# Patient Record
Sex: Male | Born: 1980
Health system: Southern US, Community
[De-identification: ages and names within clinical notes are randomized; demographics above are authoritative.]

## PROBLEM LIST (undated history)

## (undated) DIAGNOSIS — K219 Gastro-esophageal reflux disease without esophagitis: Secondary | ICD-10-CM

## (undated) HISTORY — DX: Gastro-esophageal reflux disease without esophagitis: K21.9

## (undated) HISTORY — PX: SHOULDER SURGERY: SHX246

---

## 2016-01-21 ENCOUNTER — Ambulatory Visit (INDEPENDENT_AMBULATORY_CARE_PROVIDER_SITE_OTHER): Payer: Managed Care, Other (non HMO) | Admitting: Internal Medicine

## 2016-01-21 ENCOUNTER — Other Ambulatory Visit (INDEPENDENT_AMBULATORY_CARE_PROVIDER_SITE_OTHER): Payer: Managed Care, Other (non HMO)

## 2016-01-21 ENCOUNTER — Encounter: Payer: Self-pay | Admitting: Internal Medicine

## 2016-01-21 VITALS — BP 110/78 | HR 73 | Temp 98.0°F | Resp 16 | Ht 60.0 in | Wt 222.0 lb

## 2016-01-21 DIAGNOSIS — G8929 Other chronic pain: Secondary | ICD-10-CM | POA: Insufficient documentation

## 2016-01-21 DIAGNOSIS — R51 Headache: Secondary | ICD-10-CM

## 2016-01-21 DIAGNOSIS — M25511 Pain in right shoulder: Secondary | ICD-10-CM

## 2016-01-21 DIAGNOSIS — R519 Headache, unspecified: Secondary | ICD-10-CM | POA: Insufficient documentation

## 2016-01-21 DIAGNOSIS — K219 Gastro-esophageal reflux disease without esophagitis: Secondary | ICD-10-CM | POA: Diagnosis not present

## 2016-01-21 DIAGNOSIS — Z114 Encounter for screening for human immunodeficiency virus [HIV]: Secondary | ICD-10-CM

## 2016-01-21 DIAGNOSIS — G44009 Cluster headache syndrome, unspecified, not intractable: Secondary | ICD-10-CM | POA: Diagnosis not present

## 2016-01-21 DIAGNOSIS — R7989 Other specified abnormal findings of blood chemistry: Secondary | ICD-10-CM | POA: Diagnosis not present

## 2016-01-21 DIAGNOSIS — Z23 Encounter for immunization: Secondary | ICD-10-CM | POA: Diagnosis not present

## 2016-01-21 DIAGNOSIS — Z0000 Encounter for general adult medical examination without abnormal findings: Secondary | ICD-10-CM

## 2016-01-21 LAB — COMPREHENSIVE METABOLIC PANEL WITH GFR
ALT: 42 U/L (ref 0–53)
AST: 21 U/L (ref 0–37)
Albumin: 4.7 g/dL (ref 3.5–5.2)
Alkaline Phosphatase: 44 U/L (ref 39–117)
BUN: 12 mg/dL (ref 6–23)
CO2: 27 meq/L (ref 19–32)
Calcium: 9.7 mg/dL (ref 8.4–10.5)
Chloride: 102 meq/L (ref 96–112)
Creatinine, Ser: 1.08 mg/dL (ref 0.40–1.50)
GFR: 83.41 mL/min
Glucose, Bld: 82 mg/dL (ref 70–99)
Potassium: 3.9 meq/L (ref 3.5–5.1)
Sodium: 138 meq/L (ref 135–145)
Total Bilirubin: 0.9 mg/dL (ref 0.2–1.2)
Total Protein: 7.3 g/dL (ref 6.0–8.3)

## 2016-01-21 LAB — CBC WITH DIFFERENTIAL/PLATELET
BASOS PCT: 0.5 % (ref 0.0–3.0)
Basophils Absolute: 0 10*3/uL (ref 0.0–0.1)
EOS ABS: 0.1 10*3/uL (ref 0.0–0.7)
EOS PCT: 1.3 % (ref 0.0–5.0)
HEMATOCRIT: 43 % (ref 39.0–52.0)
HEMOGLOBIN: 15.2 g/dL (ref 13.0–17.0)
LYMPHS PCT: 33.3 % (ref 12.0–46.0)
Lymphs Abs: 2.5 10*3/uL (ref 0.7–4.0)
MCHC: 35.3 g/dL (ref 30.0–36.0)
MCV: 86.5 fl (ref 78.0–100.0)
MONO ABS: 0.5 10*3/uL (ref 0.1–1.0)
Monocytes Relative: 6.8 % (ref 3.0–12.0)
Neutro Abs: 4.3 10*3/uL (ref 1.4–7.7)
Neutrophils Relative %: 58.1 % (ref 43.0–77.0)
Platelets: 275 10*3/uL (ref 150.0–400.0)
RBC: 4.97 Mil/uL (ref 4.22–5.81)
RDW: 12.8 % (ref 11.5–15.5)
WBC: 7.5 10*3/uL (ref 4.0–10.5)

## 2016-01-21 LAB — TSH: TSH: 1.28 u[IU]/mL (ref 0.35–4.50)

## 2016-01-21 LAB — LIPID PANEL
CHOLESTEROL: 268 mg/dL — AB (ref 0–200)
HDL: 35.4 mg/dL — AB (ref >39.00)
NonHDL: 232.65
Total CHOL/HDL Ratio: 8
Triglycerides: 328 mg/dL — ABNORMAL HIGH (ref 0.0–149.0)
VLDL: 65.6 mg/dL — AB (ref 0.0–40.0)

## 2016-01-21 LAB — LDL CHOLESTEROL, DIRECT: Direct LDL: 150 mg/dL

## 2016-01-21 MED ORDER — LANSOPRAZOLE 30 MG PO TBDP: 30.0000 mg | ORAL_TABLET | Freq: Every day | ORAL | 1 refills | Status: DC

## 2016-01-21 MED ORDER — HYDROCODONE-ACETAMINOPHEN 5-325 MG PO TABS: 1.0000 | ORAL_TABLET | Freq: Every day | ORAL | 0 refills | Status: DC | PRN

## 2016-01-21 NOTE — Progress Notes (Signed)
Subjective:    Patient ID: Jamie Powell, male    DOB: 1980-08-29, 35 y.o.   MRN: [id number]  HPI He is here to establish with a new pcp.     His shoulder aches in the winter.  He has seen orthopedics in the past.  He has had surgery x 3 - laparoscopic surgeries.    He was taking hydrocodone once daily, but not every day in the winter only.  It does not affect him negatively -  There are no side effects.  The pain is significant in the winter and he feels he needs something to help him get through the day. 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   GERD:  He has a long history of GERD.  He has tried several medications in the past, including nexium, prilosec, prevacid and protonix.  Only prevacid works.  It is expensive and he wonders what he can take - other otc medications do not work.  He does drink coffee in the morning, drinks some red bull and beer at night.  He does chew tobacco and knows he should stop.  His diet could be improved.     Medications and allergies reviewed with patient and updated if appropriate.  Patient Active Problem List   Diagnosis Date Noted  . Chronic right shoulder pain 01/21/2016  . GERD (gastroesophageal reflux disease) 01/21/2016  . Headache 01/21/2016    No current outpatient prescriptions on file prior to visit.   No current facility-administered medications on file prior to visit.     Past Medical History:  Diagnosis Date  . GERD (gastroesophageal reflux disease)     Past Surgical History:  Procedure Laterality Date  . SHOULDER SURGERY      Social History   Social History  . Marital status: Married    Spouse name: N/A  . Number of children: N/A  . Years of education: N/A   Social History Main Topics  . Smoking status: Former Games developermoker  . Smokeless tobacco: Current User  . Alcohol use Yes  . Drug use: No  . Sexual activity: Not Asked   Other Topics Concern  . None   Social History Financial traderarrative   mechanic    Family  History  Problem Relation Age of Onset  . Hypertension Maternal Grandfather   . Heart disease Maternal Grandfather   . Cancer Paternal Grandmother   . Stroke Paternal Grandfather   . Diverticulitis Father     Review of Systems  Constitutional: Negative for chills and fever.  HENT: Negative for hearing loss.   Eyes: Negative for visual disturbance.  Respiratory: Negative for cough, shortness of breath and wheezing.   Cardiovascular: Negative for chest pain, palpitations and leg swelling.  Gastrointestinal: Negative for abdominal pain, blood in stool, constipation and diarrhea.       Daily gerd  Genitourinary: Negative for dysuria and hematuria.  Musculoskeletal: Positive for arthralgias (right shoulder, right knee) and myalgias (cramping in ribs).  Neurological: Positive for headaches (cluster). Negative for dizziness and light-headedness.  Psychiatric/Behavioral: Negative for dysphoric mood. The patient is not nervous/anxious (inc stress).        Objective:   Vitals:   01/21/16 1453  BP: 110/78  Pulse: 73  Resp: 16  Temp: 98 F (36.7 C)   Filed Weights   01/21/16 1453  Weight: 222 lb (100.7 kg)   Body mass index is 43.36 kg/m.   Physical Exam Constitutional: He appears well-developed and well-nourished. No  distress.  HENT:  Head: Normocephalic and atraumatic.  Right Ear: External ear normal.  Left Ear: External ear normal.  Mouth/Throat: Oropharynx is clear and moist.  Normal ear canals and TM b/l  Eyes: Conjunctivae and EOM are normal.  Neck: Neck supple. No tracheal deviation present. No thyromegaly present.  No carotid bruit  Cardiovascular: Normal rate, regular rhythm, normal heart sounds and intact distal pulses.   No murmur heard.  No edema Pulmonary/Chest: Effort normal and breath sounds normal. No respiratory distress. He has no wheezes. He has no rales.  Abdominal: Soft. Bowel sounds are normal. He exhibits no distension. There is no tenderness.    Musculoskeletal: right shoulder with scars from prior surgery  Lymphadenopathy:    He has no cervical adenopathy.  Skin: Skin is warm and dry. He is not diaphoretic.  Psychiatric: He has a normal mood and affect. His behavior is normal.         Assessment & Plan:   Physical exam: Screening blood work ordered Immunizations - tetanus and flu vaccines today Exercise - very active at work and does yard work [employer] - elevated BMI, but appears normal for body build Substance abuse - stressed nicotine cessation  See Problem List for Assessment and Plan of chronic medical problems.  F/u annually, sooner if needed

## 2016-01-21 NOTE — Assessment & Plan Note (Addendum)
Stressed nicotine cessation, decreasing caffeine and alcohol Having GERD daily Start prevacid 30 mg daily Stressed importance of getting GERD controlled - risk of esoph. CA Return if not controlled - may need GI eval

## 2016-01-21 NOTE — Patient Instructions (Addendum)
Test(s) ordered today. Your results will be released to MyChart (or called to you) after review, usually within 72hours after test completion. If any changes need to be made, you will be notified at that same time.  All other Health Maintenance issues reviewed.   All recommended immunizations and age-appropriate screenings are up-to-date or discussed.  Flu and tetanus vaccines immunization administered today.   Medications reviewed and updated.  Changes include starting hydrocodone daily for your shoulder pain and prevacid for your stomach.   Your prescription(s) have been submitted to your pharmacy. Please take as directed and contact our office if you believe you are having problem(s) with the medication(s).   Please followup in one year    Heartburn Heartburn is a type of pain or discomfort that can happen in the throat or chest. It is often described as a burning pain. It may also cause a bad taste in the mouth. Heartburn may feel worse when you lie down or bend over, and it is often worse at night. Heartburn may be caused by stomach contents that move back up into the esophagus (reflux). Follow these instructions at home: Take these actions to decrease your discomfort and to help avoid complications. Diet  Follow a diet as recommended by your health care provider. This may involve avoiding foods and drinks such as:  Coffee and tea (with or without caffeine).  Drinks that contain alcohol.  Energy drinks and sports drinks.  Carbonated drinks or sodas.  Chocolate and cocoa.  Peppermint and mint flavorings.  Garlic and onions.  Horseradish.  Spicy and acidic foods, including peppers, chili powder, curry powder, vinegar, hot sauces, and barbecue sauce.  Citrus fruit juices and citrus fruits, such as oranges, lemons, and limes.  Tomato-based foods, such as red sauce, chili, salsa, and pizza with red sauce.  Fried and fatty foods, such as donuts, french fries, potato  chips, and high-fat dressings.  High-fat meats, such as hot dogs and fatty cuts of red and white meats, such as rib eye steak, sausage, ham, and bacon.  High-fat dairy items, such as whole milk, butter, and cream cheese.  Eat small, frequent meals instead of large meals.  Avoid drinking large amounts of liquid with your meals.  Avoid eating meals during the 2-3 hours before bedtime.  Avoid lying down right after you eat.  Do not exercise right after you eat. General instructions  Pay attention to any changes in your symptoms.  Take over-the-counter and prescription medicines only as told by your health care provider. Do not take aspirin, ibuprofen, or other NSAIDs unless your health care provider told you to do so.  Do not use any tobacco products, including cigarettes, chewing tobacco, and e-cigarettes. If you need help quitting, ask your health care provider.  Wear loose-fitting clothing. Do not wear anything tight around your waist that causes pressure on your abdomen.  Raise (elevate) the head of your bed about 6 inches (15 cm).  Try to reduce your stress, such as with yoga or meditation. If you need help reducing stress, ask your health care provider.  If you are overweight, reduce your weight to an amount that is healthy for you. Ask your health care provider for guidance about a safe weight loss goal.  Keep all follow-up visits as told by your health care provider. This is important. Contact a health care provider if:  You have new symptoms.  You have unexplained weight loss.  You have difficulty swallowing, or it hurts to swallow.  You have wheezing or a persistent cough.  Your symptoms do not improve with treatment.  You have frequent heartburn for more than two weeks. Get help right away if:  You have pain in your arms, neck, jaw, teeth, or back.  You feel sweaty, dizzy, or light-headed.  You have chest pain or shortness of breath.  You vomit and your  vomit looks like blood or coffee grounds.  Your stool is bloody or black. This information is not intended to replace advice given to you by your health care provider. Make sure you discuss any questions you have with your health care provider. Document Released: 06/27/2008 Document Revised: 07/17/2015 Document Reviewed: 06/05/2014 Elsevier Interactive Patient Education  2017 Elsevier Inc[employer]   Health Maintenance, Male A healthy lifestyle and preventative care can promote health and wellness.  Maintain regular health, dental, and eye exams.  Eat a healthy diet. Foods like vegetables, fruits, whole grains, low-fat dairy products, and lean protein foods contain the nutrients you need and are low in calories. Decrease your intake of foods high in solid fats, added sugars, and salt. Get information about a proper diet from your health care provider, if necessary.  Regular physical exercise is one of the most important things you can do for your health. Most adults should get at least 150 minutes of moderate-intensity exercise (any activity that increases your heart rate and causes you to sweat) each week. In addition, most adults need muscle-strengthening exercises on 2 or more days a week.   Maintain a healthy weight. The body mass index (BMI) is a screening tool to identify possible weight problems. It provides an estimate of body fat based on height and weight. Your health care provider can find your BMI and can help you achieve or maintain a healthy weight. For males 20 years and older:  A BMI below 18.5 is considered underweight.  A BMI of 18.5 to 24.9 is normal.  A BMI of 25 to 29.9 is considered overweight.  A BMI of 30 and above is considered obese.  Maintain normal blood lipids and cholesterol by exercising and minimizing your intake of saturated fat. Eat a balanced diet with plenty of fruits and vegetables. Blood tests for lipids and cholesterol should begin at age 33 and be repeated  every 5 years. If your lipid or cholesterol levels are high, you are over age 33, or you are at high risk for heart disease, you may need your cholesterol levels checked more frequently.Ongoing high lipid and cholesterol levels should be treated with medicines if diet and exercise are not working.  If you smoke, find out from your health care provider how to quit. If you do not use tobacco, do not start.  Lung cancer screening is recommended for adults aged 55-80 years who are at high risk for developing lung cancer because of a history of smoking. A yearly low-dose CT scan of the lungs is recommended for people who have at least a 30-pack-year history of smoking and are current smokers or have quit within the past 15 years. A pack year of smoking is smoking an average of 1 pack of cigarettes a day for 1 year (for example, a 30-pack-year history of smoking could mean smoking 1 pack a day for 30 years or 2 packs a day for 15 years). Yearly screening should continue until the smoker has stopped smoking for at least 15 years. Yearly screening should be stopped for people who develop a health problem that would prevent them  from having lung cancer treatment.  If you choose to drink alcohol, do not have more than 2 drinks per day. One drink is considered to be 12 oz (360 mL) of beer, 5 oz (150 mL) of wine, or 1.5 oz (45 mL) of liquor.  Avoid the use of street drugs. Do not share needles with anyone. Ask for help if you need support or instructions about stopping the use of drugs.  High blood pressure causes heart disease and increases the risk of stroke. High blood pressure is more likely to develop in:  People who have blood pressure in the end of the normal range (100-139/85-89 mm Hg).  People who are overweight or obese.  People who are African American.  If you are 26-50 years of age, have your blood pressure checked every 3-5 years. If you are 26 years of age or older, have your blood pressure  checked every year. You should have your blood pressure measured twice-once when you are at a hospital or clinic, and once when you are not at a hospital or clinic. Record the average of the two measurements. To check your blood pressure when you are not at a hospital or clinic, you can use:  An automated blood pressure machine at a pharmacy.  A home blood pressure monitor.  If you are 30-86 years old, ask your health care provider if you should take aspirin to prevent heart disease.  Diabetes screening involves taking a blood sample to check your fasting blood sugar level. This should be done once every 3 years after age 68 if you are at a normal weight and without risk factors for diabetes. Testing should be considered at a younger age or be carried out more frequently if you are overweight and have at least 1 risk factor for diabetes.  Colorectal cancer can be detected and often prevented. Most routine colorectal cancer screening begins at the age of 4 and continues through age 61. However, your health care provider may recommend screening at an earlier age if you have risk factors for colon cancer. On a yearly basis, your health care provider may provide home test kits to check for hidden blood in the stool. A small camera at the end of a tube may be used to directly examine the colon (sigmoidoscopy or colonoscopy) to detect the earliest forms of colorectal cancer. Talk to your health care provider about this at age 53 when routine screening begins. A direct exam of the colon should be repeated every 5-10 years through age 74, unless early forms of precancerous polyps or small growths are found.  People who are at an increased risk for hepatitis B should be screened for this virus. You are considered at high risk for hepatitis B if:  You were born in a country where hepatitis B occurs often. Talk with your health care provider about which countries are considered high risk.  Your parents were  born in a high-risk country and you have not received a shot to protect against hepatitis B (hepatitis B vaccine).  You have HIV or AIDS.  You use needles to inject street drugs.  You live with, or have sex with, someone who has hepatitis B.  You are a man who has sex with other men (MSM).  You get hemodialysis treatment.  You take certain medicines for conditions like cancer, organ transplantation, and autoimmune conditions.  Hepatitis C blood testing is recommended for all people born from [birth year] through 1963 and any individual with  known risk factors for hepatitis C.  Healthy men should no longer receive prostate-specific antigen (PSA) blood tests as part of routine cancer screening. Talk to your health care provider about prostate cancer screening.  Testicular cancer screening is not recommended for adolescents or adult males who have no symptoms. Screening includes self-exam, a health care provider exam, and other screening tests. Consult with your health care provider about any symptoms you have or any concerns you have about testicular cancer.  Practice safe sex. Use condoms and avoid high-risk sexual practices to reduce the spread of sexually transmitted infections (STIs).  You should be screened for STIs, including gonorrhea and chlamydia if:  You are sexually active and are younger than 24 years.  You are older than 24 years, and your health care provider tells you that you are at risk for this type of infection.  Your sexual activity has changed since you were last screened, and you are at an increased risk for chlamydia or gonorrhea. Ask your health care provider if you are at risk.  If you are at risk of being infected with HIV, it is recommended that you take a prescription medicine daily to prevent HIV infection. This is called pre-exposure prophylaxis (PrEP). You are considered at risk if:  You are a man who has sex with other men (MSM).  You are a heterosexual man who  is sexually active with multiple partners.  You take drugs by injection.  You are sexually active with a partner who has HIV.  Talk with your health care provider about whether you are at high risk of being infected with HIV. If you choose to begin PrEP, you should first be tested for HIV. You should then be tested every 3 months for as long as you are taking PrEP.  Use sunscreen. Apply sunscreen liberally and repeatedly throughout the day. You should seek shade when your shadow is shorter than you. Protect yourself by wearing long sleeves, pants, a wide-brimmed hat, and sunglasses year round whenever you are outdoors.  Tell your health care provider of new moles or changes in moles, especially if there is a change in shape or color. Also, tell your health care provider if a mole is larger than the size of a pencil eraser.  A one-time screening for abdominal aortic aneurysm (AAA) and surgical repair of large AAAs by ultrasound is recommended for men aged 65-75 years who are current or former smokers.  Stay current with your vaccines (immunizations). This information is not intended to replace advice given to you by your health care provider. Make sure you discuss any questions you have with your health care provider. Document Released: 08/07/2007 Document Revised: 03/01/2014 Document Reviewed: 11/12/2014 Elsevier Interactive Patient Education  2017 [employer].

## 2016-01-21 NOTE — Assessment & Plan Note (Addendum)
Saw a neurologist in past - cluster headaches Improving with chiropractor adjustements

## 2016-01-21 NOTE — Progress Notes (Signed)
Pre visit review using our clinic review tool, if applicable. No additional management support is needed unless otherwise documented below in the visit note. 

## 2016-01-22 ENCOUNTER — Other Ambulatory Visit: Payer: Self-pay | Admitting: Internal Medicine

## 2016-01-22 LAB — HIV ANTIBODY (ROUTINE TESTING W REFLEX): HIV: NONREACTIVE

## 2016-01-22 MED ORDER — RABEPRAZOLE SODIUM 20 MG PO TBEC: 20.0000 mg | DELAYED_RELEASE_TABLET | Freq: Every day | ORAL | 1 refills | Status: DC

## 2016-01-22 MED FILL — HYDROCODON-APAP 5-325: 5-325 | 30 days supply | Qty: 30 | Fill #0

## 2016-01-22 NOTE — Assessment & Plan Note (Signed)
Chronic, tolerates it spring- fall Needs pain med in winter - has taken hydrocodone at most once a day in winter and that has helped - he denies sleepiness, nausea or other side effects Will prescribe it for once daily for winter months only  Discussed risks of addiction - limit to one a day - winter months only

## 2016-03-02 ENCOUNTER — Telehealth: Payer: Self-pay | Admitting: Emergency Medicine

## 2016-03-02 MED ORDER — HYDROCODONE-ACETAMINOPHEN 5-325 MG PO TABS: 1.0000 | ORAL_TABLET | Freq: Every day | ORAL | 0 refills | Status: DC | PRN

## 2016-03-02 MED FILL — HYDROCODON-APAP 5-325: 5-325 | 30 days supply | Qty: 30 | Fill #0

## 2016-03-02 NOTE — Telephone Encounter (Signed)
Ok to fill 

## 2016-03-02 NOTE — Telephone Encounter (Signed)
Please advise, pt is requesting a refill on hydrocodone. Last filled on 01/21/16

## 2016-03-02 NOTE — Telephone Encounter (Signed)
RX given to pt's wife.   

## 2016-04-29 ENCOUNTER — Ambulatory Visit (INDEPENDENT_AMBULATORY_CARE_PROVIDER_SITE_OTHER): Payer: 59 | Admitting: Family Medicine

## 2016-04-29 ENCOUNTER — Ambulatory Visit: Payer: Self-pay

## 2016-04-29 ENCOUNTER — Encounter: Payer: Self-pay | Admitting: Family Medicine

## 2016-04-29 VITALS — BP 110/80 | HR 86 | Ht 72.0 in | Wt 236.0 lb

## 2016-04-29 DIAGNOSIS — M25511 Pain in right shoulder: Secondary | ICD-10-CM

## 2016-04-29 DIAGNOSIS — G8929 Other chronic pain: Secondary | ICD-10-CM

## 2016-04-29 MED ORDER — VITAMIN D (ERGOCALCIFEROL) 1.25 MG (50000 UNIT) PO CAPS: 50000.0000 [IU] | ORAL_CAPSULE | ORAL | 0 refills | Status: DC

## 2016-04-29 MED ORDER — DICLOFENAC SODIUM 2 % TD SOLN: TRANSDERMAL | 3 refills | Status: DC

## 2016-04-29 MED FILL — VIT D2 1.25 MG (50,000 UNIT: 1.25 MG | 28 days supply | Qty: 4 | Fill #0

## 2016-04-29 NOTE — Progress Notes (Signed)
Tawana ScaleZach Smith D.O. [facility] [address] [place], [place] [address] Phone: [phone] Subjective:    I'm seeing this patient by the request  of:  Pincus SanesStacy J Burns, MD   CC: Chronic shoulder pain  ION:[id number]:Subjective  Jamie BarrioDaniel Powell is a 36 y.o. male coming in with complaint of Chronic right-sided shoulder pain. Has seen different provider for this previously. Last talked to primary care provider good in 3 months ago about this problem. Patient had been on chronic pain medication. States that it seems to be worse during the winter. 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 Patient states Having some mild discomfort at all times. States though feels unstable. No numbness, mild weakness.  Hx of 3 previous surgeries. Rates severity is 5/10.       Past Medical History:  Diagnosis Date  . GERD (gastroesophageal reflux disease)    Past Surgical History:  Procedure Laterality Date  . SHOULDER SURGERY     Social History   Social History  . Marital status: Married    Spouse name: N/A  . Number of children: N/A  . Years of education: N/A   Social History Main Topics  . Smoking status: Former Games developermoker  . Smokeless tobacco: Current User  . Alcohol use Yes  . Drug use: No  . Sexual activity: Not on file   Other Topics Concern  . Not on file   Social History Narrative   Curatormechanic   Not on File Family History  Problem Relation Age of Onset  . Hypertension Maternal Grandfather   . Heart disease Maternal Grandfather   . Cancer Paternal Grandmother   . Stroke Paternal Grandfather   . Diverticulitis Father     Past medical history, social, surgical and family history all reviewed in electronic medical record.  No pertanent information unless stated regarding to the chief complaint.   Review of Systems:Review of systems updated and as accurate as of 04/29/16  No headache, visual changes, nausea, vomiting, diarrhea, constipation, dizziness, abdominal pain,  skin rash, fevers, chills, night sweats, weight loss, swollen lymph nodes, body aches, joint swelling, muscle aches, chest pain, shortness of breath, mood changes.   Objective  Blood pressure 110/80, pulse 86, height 6' (1.829 m), weight 236 lb (107 kg). Systems examined below as of 04/29/16   General: No apparent distress alert and oriented x3 mood and affect normal, dressed appropriately.  HEENT: Pupils equal, extraocular movements intact  Respiratory: Patient's speak in full sentences and does not appear short of breath  Cardiovascular: No lower extremity edema, non tender, no erythema  Skin: Warm dry intact with no signs of infection or rash on extremities or on axial skeleton.  Abdomen: Soft nontender  Neuro: Cranial nerves II through XII are intact, neurovascularly intact in all extremities with 2+ DTRs and 2+ pulses.  Lymph: No lymphadenopathy of posterior or anterior cervical chain or axillae bilaterally.  Gait normal with good balance and coordination.  MSK:  Non tender with full range of motion and good stability and symmetric strength and tone of  elbows, wrist, hip, knee and ankles bilaterally.  Shoulder: Right Inspection reveals no abnormalities, atrophy asymmetry of AC joint noted with post surgical changes.  Mild pain over the distal clavicle, no AC joint palpated.  ROM is full in all planes passively. Instability anteriorly.  4+/5 pain  signs of impingement with positive Neer and Hawkin's tests, but negative empty can sign. Speeds and Yergason's tests mild positive. Marland Kitchen. No  labral pathology noted with negative Obrien's, negative clunk and good stability. Normal scapular function observed. No painful arc and no drop arm sign. + apprehension sign Contralateral shoulder normal.   MSK US performed of: Right This study was ordered, performed, and interpreted by Terrilee Files D.O.  Shoulder:   Supraspinatus:  Appears normal on long and transverse views, removal of AC joint noted.  . Infraspinatus:  Appears normal on long and transverse views.  Subscapularis:  Appears normal on long and transverse views.  Teres Minor:  Appears normal on long and transverse views. AC joint:  Non existent.  Glenohumeral Joint:  Appears normal without effusion. Glenoid Labrum:  Intact without visualized tears. Biceps Tendon:  Appears normal on long and transverse views, no fraying of tendon, tendon located in intertubercular groove, no subluxation with shoulder internal or external rotation.  Impression: post surgical changes      Impression and Recommendations:     This case required medical decision making of moderate complexity.      Note: This dictation was prepared with Dragon dictation along with smaller phrase technology. Any transcriptional errors that result from this process are unintentional.

## 2016-04-29 NOTE — Patient Instructions (Addendum)
Good to see you.  Ice 20 minutes 2 times daily. Usually after activity and before bed. Exercises 3 times a week.  pennsaid pinkie amount topically 2 times daily as needed.  Once weekly vitamin D for the next 12 weeks.  We will get a brace as well If not too expensive for the shoulder.  Avoid heavy lifting with hands outside of your peripheral vision.  For the knee try a patella strap to wear at work.  See me again in 4 weeks.

## 2016-04-29 NOTE — Assessment & Plan Note (Signed)
Chronic instability noted. I do believe that this is what is contribute in the most patient's pain. Patient's rotator cuff seems to be intact the patient does have the anterior instability. We discussed the possibility of bracing, given home exercises and work with Event organiserathletic trainer, topical anti-inflammatory's prescribed think will be beneficial and once avoid vitamin D to help with muscle strength and muscle endurance. Patient will try these different changes, neck and see me again in 4 weeks. Has been formal physical therapy in the past but may need another round.

## 2016-05-26 NOTE — Progress Notes (Signed)
[name] [address] [place], [place] [address] Phone: [phone] Subjective:    I'm seeing this patient by the request  of:  Pincus Sanes, MD   CC: Chronic shoulder pain f/u  [id number]  Jamie Powell is a 36 y.o. male coming in with complaint of Chronic right-sided shoulder pain. Patient sent have significant instability of the shoulder secondary to the removal the clavicle. Patient was to start with home exercises and was to get a custom brace to help him. Patient states that overall his shoulder seems to be doing relatively well. Patient has been much more careful in how he moves it. Because that that seems to be doing better. Not as severe pain at night but still can wake him up at night.  Patient also has a history of headaches. Has been diagnosed with cluster headaches. Has responded fairly well to manipulation previously. Has noticed some increasing neck pain on the right side. Seems to radiate down towards her scapula as well as. Sometimes thinks that this can cause some of the shoulder pain. Patient denies any visual changes. Does have some photophobia from time to time.      Past Medical History:  Diagnosis Date  . GERD (gastroesophageal reflux disease)    Past Surgical History:  Procedure Laterality Date  . SHOULDER SURGERY     Social History   Social History  . Marital status: Married    Spouse name: N/A  . Number of children: N/A  . Years of education: N/A   Social History Main Topics  . Smoking status: Former Games developer  . Smokeless tobacco: Current User  . Alcohol use Yes  . Drug use: No  . Sexual activity: Not on file   Other Topics Concern  . 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 Hypertension Maternal Grandfather   . Heart disease Maternal Grandfather   . Cancer Paternal Grandmother   . Stroke Paternal Grandfather   . Diverticulitis  Father     Past medical history, social, surgical and family history all reviewed in electronic medical record.  No pertanent information unless stated regarding to the chief complaint.   Review of Systems: No  visual changes, nausea, vomiting, diarrhea, constipation, dizziness, abdominal pain, skin rash, fevers, chills, night sweats, weight loss, swollen lymph nodes, body aches, joint swelling, muscle aches, chest pain, shortness of breath, mood changes.  Positive headache  Objective  There were no vitals taken for this visit.   Systems examined below as of 05/27/16 General: NAD A&O x3 mood, affect normal  HEENT: Pupils equal, extraocular movements intact no nystagmus Respiratory: not short of breath at rest or with speaking Cardiovascular: No lower extremity edema, non tender Skin: Warm dry intact with no signs of infection or rash on extremities or on axial skeleton. Abdomen: Soft nontender, no masses Neuro: Cranial nerves  intact, neurovascularly intact in all extremities with 2+ DTRs and 2+ pulses. Lymph: No lymphadenopathy appreciated today  Gait normal with good balance and coordination.  MSK: Non tender with full range of motion and good stability and symmetric strength and tone of  elbows, wrist,  knee hips and ankles bilaterally.   Shoulder: Right Inspection reveals no abnormalities, atrophy asymmetry of AC joint noted with post surgical changes.  Mild pain over the distal clavicle, no AC joint palpated.  ROM is full in all planes passively.  Instability anteriorly.  4+/5 pain  signs of impingement with positive Neer and Hawkin's tests, but negative empty can sign. Speeds and Yergason's tests mild positive. . No labral pathology noted with negative Obrien's, negative clunk and good stability. Normal scapular function observed. No painful arc and no drop arm sign. + apprehension sign Contralateral shoulder normal.   No change from previous exam  Neck: Inspection  unremarkable. No palpable stepoffs. Negative Spurling's maneuver. Lacks last 5 of rotation Grip strength and sensation normal in bilateral hands Strength good C4 to T1 distribution No sensory change to C4 to T1 Negative Hoffman sign bilaterally Reflexes normal  Osteopathic findings Cervical C2 flexed rotated and side bent right C4 flexed rotated and side bent left C6 flexed rotated and side bent left T3 extended rotated and side bent right inhaled third rib T9 extended rotated and side bent left L2 flexed rotated and side bent right Sacrum right on right     Impression and Recommendations:     This case required medical decision making of moderate complexity.      Note: This dictation was prepared with Dragon dictation along with smaller phrase technology. Any transcriptional errors that result from this process are unintentional.

## 2016-05-27 ENCOUNTER — Encounter: Payer: Self-pay | Admitting: Family Medicine

## 2016-05-27 ENCOUNTER — Ambulatory Visit (INDEPENDENT_AMBULATORY_CARE_PROVIDER_SITE_OTHER): Payer: 59 | Admitting: Family Medicine

## 2016-05-27 DIAGNOSIS — M999 Biomechanical lesion, unspecified: Secondary | ICD-10-CM | POA: Insufficient documentation

## 2016-05-27 DIAGNOSIS — G44009 Cluster headache syndrome, unspecified, not intractable: Secondary | ICD-10-CM

## 2016-05-27 DIAGNOSIS — M25311 Other instability, right shoulder: Secondary | ICD-10-CM | POA: Diagnosis not present

## 2016-05-27 MED ORDER — MELOXICAM 15 MG PO TABS: 15.0000 mg | ORAL_TABLET | Freq: Every day | ORAL | 3 refills | Status: DC

## 2016-05-27 MED FILL — MELOXICAM 15 MG TABLET: 15 | 30 days supply | Qty: 30 | Fill #0

## 2016-05-27 NOTE — Assessment & Plan Note (Signed)
Decision today to treat with OMT was based on Physical Exam  After verbal consent patient was treated with HVLA, ME, FPR techniques in cervical, thoracic, rib lumbar and sacral areas  Patient tolerated the procedure well with improvement in symptoms  Patient given exercises, stretches and lifestyle modifications  See medications in patient instructions if given  Patient will follow up in 6-12 weeks 

## 2016-05-27 NOTE — Progress Notes (Signed)
Pre visit review using our clinic review tool, if applicable. No additional management support is needed unless otherwise documented below in the visit note. 

## 2016-05-27 NOTE — Assessment & Plan Note (Signed)
Patient does get headaches that seemed to be more consistent with cluster headaches. Patient has responded to manipulation in the past by another provider. We attempted some today. Patient didn't spot well and had increasing range of motion of the neck and medially. His lungs patient as well as see him on 6-12 week intervals. Discussed posture and ergonomics.

## 2016-05-27 NOTE — Patient Instructions (Signed)
Good to see you  Ice 20 minutes 2 times daily. Usually after activity and before bed. Lets try the manipulation from time to time for the neck.  We will reach out to [facility] and have them contact you  See me again in 6 weeks.

## 2016-05-27 NOTE — Assessment & Plan Note (Signed)
Visual have chronic instability. 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 Patient is being fitted for custom brace to help him avoid chronic subluxation. Patient will come back and see me again in 6 weeks to make sure he is doing well. Continue the vitamin D and given meloxicam for breakthrough with him not being able to do the topical anti-inflammatory. Patient will come back after getting the brace and we'll make sure patient is doing relatively well.

## 2016-11-11 ENCOUNTER — Ambulatory Visit (INDEPENDENT_AMBULATORY_CARE_PROVIDER_SITE_OTHER): Payer: BLUE CROSS/BLUE SHIELD

## 2016-11-11 DIAGNOSIS — Z23 Encounter for immunization: Secondary | ICD-10-CM | POA: Diagnosis not present

## 2017-01-04 ENCOUNTER — Ambulatory Visit (INDEPENDENT_AMBULATORY_CARE_PROVIDER_SITE_OTHER): Payer: BLUE CROSS/BLUE SHIELD | Admitting: Internal Medicine

## 2017-01-04 ENCOUNTER — Encounter: Payer: Self-pay | Admitting: Internal Medicine

## 2017-01-04 ENCOUNTER — Other Ambulatory Visit (INDEPENDENT_AMBULATORY_CARE_PROVIDER_SITE_OTHER): Payer: BLUE CROSS/BLUE SHIELD

## 2017-01-04 DIAGNOSIS — R17 Unspecified jaundice: Secondary | ICD-10-CM | POA: Diagnosis not present

## 2017-01-04 DIAGNOSIS — G44011 Episodic cluster headache, intractable: Secondary | ICD-10-CM

## 2017-01-04 DIAGNOSIS — M25511 Pain in right shoulder: Secondary | ICD-10-CM

## 2017-01-04 DIAGNOSIS — G8929 Other chronic pain: Secondary | ICD-10-CM

## 2017-01-04 DIAGNOSIS — G44009 Cluster headache syndrome, unspecified, not intractable: Secondary | ICD-10-CM | POA: Insufficient documentation

## 2017-01-04 LAB — CBC WITH DIFFERENTIAL/PLATELET
BASOS PCT: 0.7 % (ref 0.0–3.0)
Basophils Absolute: 0 10*3/uL (ref 0.0–0.1)
EOS ABS: 0 10*3/uL (ref 0.0–0.7)
Eosinophils Relative: 0.5 % (ref 0.0–5.0)
HEMATOCRIT: 44.3 % (ref 39.0–52.0)
HEMOGLOBIN: 15 g/dL (ref 13.0–17.0)
LYMPHS PCT: 32.7 % (ref 12.0–46.0)
Lymphs Abs: 2.4 10*3/uL (ref 0.7–4.0)
MCHC: 34 g/dL (ref 30.0–36.0)
MCV: 89.9 fl (ref 78.0–100.0)
MONO ABS: 0.5 10*3/uL (ref 0.1–1.0)
Monocytes Relative: 6.5 % (ref 3.0–12.0)
NEUTROS ABS: 4.4 10*3/uL (ref 1.4–7.7)
Neutrophils Relative %: 59.6 % (ref 43.0–77.0)
Platelets: 276 10*3/uL (ref 150.0–400.0)
RBC: 4.92 Mil/uL (ref 4.22–5.81)
RDW: 13.2 % (ref 11.5–15.5)
WBC: 7.3 10*3/uL (ref 4.0–10.5)

## 2017-01-04 LAB — COMPREHENSIVE METABOLIC PANEL
ALT: 46 U/L (ref 0–53)
AST: 21 U/L (ref 0–37)
Albumin: 4.8 g/dL (ref 3.5–5.2)
Alkaline Phosphatase: 39 U/L (ref 39–117)
BUN: 9 mg/dL (ref 6–23)
CHLORIDE: 103 meq/L (ref 96–112)
CO2: 28 meq/L (ref 19–32)
CREATININE: 1.14 mg/dL (ref 0.40–1.50)
Calcium: 9.7 mg/dL (ref 8.4–10.5)
GFR: 77.92 mL/min (ref >60.00)
Glucose, Bld: 79 mg/dL (ref 70–99)
Potassium: 3.7 mEq/L (ref 3.5–5.1)
SODIUM: 139 meq/L (ref 135–145)
Total Bilirubin: 0.9 mg/dL (ref 0.2–1.2)
Total Protein: 7.4 g/dL (ref 6.0–8.3)

## 2017-01-04 LAB — LIPASE: Lipase: 25 U/L (ref 11.0–59.0)

## 2017-01-04 LAB — AMYLASE: Amylase: 32 U/L (ref 27–131)

## 2017-01-04 MED ORDER — HYDROCODONE-ACETAMINOPHEN 5-325 MG PO TABS: 1.0000 | ORAL_TABLET | Freq: Every day | ORAL | 0 refills | Status: DC | PRN

## 2017-01-04 MED FILL — HYDROCODON-APAP 5-325: 5-325 | 5 days supply | Qty: 5 | Fill #0

## 2017-01-04 NOTE — Progress Notes (Signed)
Subjective:    Patient ID: Jamie Powell, male    DOB: 01/18/1981, 36 y.o.   MRN: [id number]  HPI He is here for an acute visit.   Cluster headaches: he has had cluster headaches for years and has seen several specialists for it.  No medication has worked.  Last week he had several headaches.  He gets a lot in the spring and fall.  The worse one he had to date was last thursday.   It started about 8pm .  The pain was on the right side of his head and radiates to his neck and right eye.  This eye was watering.  He vomited and it was violent - he had dry heaves. He never had vomiting so violent before.  His eyes were bloodshot and around his eyes were black and blue from vomiting.  He started to have a yellow color to his eyes three days ago.  Him and his wife were concerned about his liver.     He took excedrin during the day and excedrin at night and tylenol as needed over the past week for his headaches. The medications do not really help with the headaches.  He still has pressure in the head.  He sees the chiropractor after this appointment and it helped in the past.    Chronic right shoulder pain:  He takes norco only when needed for severe right shoulder pain.  He has seen has had 3 surgeries in the past on the shoulder.  He has pain only when the weather is very cold.    Medications and allergies reviewed with patient and updated if appropriate.  Patient Active Problem List   Diagnosis Date Noted  . Instability of right shoulder joint 05/27/2016  . Nonallopathic lesion of thoracic region 05/27/2016  . Nonallopathic lesion of cervical region 05/27/2016  . Nonallopathic lesion of rib cage 05/27/2016  . Chronic right shoulder pain 01/21/2016  . GERD (gastroesophageal reflux disease) 01/21/2016  . Headache 01/21/2016    Current Outpatient Medications on File Prior to Visit  Medication Sig Dispense Refill  . HYDROcodone-acetaminophen (NORCO/VICODIN) 5-325 MG tablet Take 1 tablet by  mouth daily as needed for moderate pain. 30 tablet 0  . meloxicam (MOBIC) 15 MG tablet Take 1 tablet (15 mg total) by mouth daily. 30 tablet 3  . Vitamin D, Ergocalciferol, (DRISDOL) 50000 units CAPS capsule Take 1 capsule (50,000 Units total) by mouth every 7 (seven) days. 12 capsule 0   No current facility-administered medications on file prior to visit.     Past Medical History:  Diagnosis Date  . GERD (gastroesophageal reflux disease)     Past Surgical History:  Procedure Laterality Date  . SHOULDER SURGERY      Social History   Socioeconomic History  . Marital status: Married    Spouse name: Not on file  . Number of children: Not on file  . Years of education: Not on file  . Highest education level: Not on file  Social Needs  . Financial resource strain: Not on file  . Food insecurity - worry: Not on file  . Food insecurity - inability: Not on file  . Transportation needs - medical: Not on file  . Transportation needs - non-medical: Not on file  Occupational History  . Not on file  Tobacco Use  . Smoking status: Former Games developermoker  . Smokeless tobacco: Current User  Substance and Sexual Activity  . Alcohol use: Yes  . Drug use:  No  . Sexual activity: Not on file  Other Topics Concern  . Not on file  Social History Narrative   Curatormechanic    Family History  Problem Relation Age of Onset  . Hypertension Maternal Grandfather   . Heart disease Maternal Grandfather   . Cancer Paternal Grandmother   . Stroke Paternal Grandfather   . Diverticulitis Father     Review of Systems  Constitutional: Negative for chills and fever.  Eyes: Positive for discharge (watering of right eye). Negative for visual disturbance.  Gastrointestinal: Negative for abdominal pain, blood in stool, constipation, diarrhea, nausea and vomiting.  Genitourinary: Negative for dysuria and hematuria (no change in urine color).  Neurological: Positive for headaches.       Objective:   Vitals:     01/04/17 1456  BP: 112/78  Pulse: 80  Resp: 16  Temp: 98.3 F (36.8 C)  SpO2: 97%   Filed Weights   01/04/17 1456  Weight: 226 lb (102.5 kg)   Body mass index is 30.65 kg/m.  Wt Readings from Last 3 Encounters:  01/04/17 226 lb (102.5 kg)  05/27/16 240 lb 3.2 oz (109 kg)  04/29/16 236 lb (107 kg)     Physical Exam  Constitutional: He is oriented to person, place, and time. He appears well-developed and well-nourished. No distress.  HENT:  Head: Normocephalic and atraumatic.  Mouth/Throat: Oropharynx is clear and moist.  No jaundice under tongue  Eyes: EOM are normal. Pupils are equal, round, and reactive to light. Scleral icterus (mild b/l) is present.  B/l lower eye subconjunctival hemorrhages  Abdominal: Soft. He exhibits no distension and no mass. There is no tenderness. There is no rebound and no guarding.  Musculoskeletal: He exhibits no edema.  Neurological: He is alert and oriented to person, place, and time.  Skin: Skin is warm and dry. He is not diaphoretic.  Psychiatric: He has a normal mood and affect. His behavior is normal.          Assessment & Plan:   See Problem List for Assessment and Plan of chronic medical problems.

## 2017-01-04 NOTE — Patient Instructions (Signed)
  Test(s) ordered today. Your results will be released to MyChart (or called to you) after review, usually within 72hours after test completion. If any changes need to be made, you will be notified at that same time.    Medications reviewed and updated.  No changes recommended at this time.     

## 2017-01-04 NOTE — Assessment & Plan Note (Signed)
Has seen Dr Katrinka BlazingSmith advised exercises to help instability of shoulder - consider PT again Will continue norco only as needed for severe pain only - uses only with cold weather 5 days supply given today Will discuss further at his CPE - needs pain contract and will need to be seen 2/year

## 2017-01-04 NOTE — Assessment & Plan Note (Signed)
Had clusters typically in spring and fall Has seen headache specialists and no treatment has worked Has headache today - discussed toradol or prednisone injection - he deferred since they have not helped in past Avoid overuse of tylenol and excedrin Consider referral back to headache specialist

## 2017-01-04 NOTE — Assessment & Plan Note (Signed)
Likely from eye subconjunctival hemorrhages, no jaundice under tongue reassured  But will check labs today

## 2017-01-19 ENCOUNTER — Encounter: Payer: Self-pay | Admitting: Internal Medicine

## 2017-01-19 ENCOUNTER — Encounter: Payer: Self-pay | Admitting: Emergency Medicine

## 2017-01-19 ENCOUNTER — Other Ambulatory Visit (INDEPENDENT_AMBULATORY_CARE_PROVIDER_SITE_OTHER): Payer: BLUE CROSS/BLUE SHIELD

## 2017-01-19 ENCOUNTER — Ambulatory Visit (INDEPENDENT_AMBULATORY_CARE_PROVIDER_SITE_OTHER): Payer: BLUE CROSS/BLUE SHIELD | Admitting: Internal Medicine

## 2017-01-19 VITALS — BP 114/82 | HR 74 | Temp 98.5°F | Resp 16 | Ht 72.0 in | Wt 223.0 lb

## 2017-01-19 DIAGNOSIS — M25511 Pain in right shoulder: Secondary | ICD-10-CM

## 2017-01-19 DIAGNOSIS — J0100 Acute maxillary sinusitis, unspecified: Secondary | ICD-10-CM | POA: Insufficient documentation

## 2017-01-19 DIAGNOSIS — E785 Hyperlipidemia, unspecified: Secondary | ICD-10-CM | POA: Insufficient documentation

## 2017-01-19 DIAGNOSIS — Z0000 Encounter for general adult medical examination without abnormal findings: Secondary | ICD-10-CM

## 2017-01-19 DIAGNOSIS — G8929 Other chronic pain: Secondary | ICD-10-CM | POA: Diagnosis not present

## 2017-01-19 DIAGNOSIS — E7849 Other hyperlipidemia: Secondary | ICD-10-CM

## 2017-01-19 DIAGNOSIS — R52 Pain, unspecified: Secondary | ICD-10-CM

## 2017-01-19 LAB — LIPID PANEL
CHOLESTEROL: 216 mg/dL — AB (ref 0–200)
HDL: 26.3 mg/dL — ABNORMAL LOW (ref >39.00)
NonHDL: 189.55
Total CHOL/HDL Ratio: 8
Triglycerides: 208 mg/dL — ABNORMAL HIGH (ref 0.0–149.0)
VLDL: 41.6 mg/dL — ABNORMAL HIGH (ref 0.0–40.0)

## 2017-01-19 LAB — TSH: TSH: 0.77 u[IU]/mL (ref 0.35–4.50)

## 2017-01-19 LAB — LDL CHOLESTEROL, DIRECT: LDL DIRECT: 149 mg/dL

## 2017-01-19 MED ORDER — AMOXICILLIN-POT CLAVULANATE 875-125 MG PO TABS: 1.0000 | ORAL_TABLET | Freq: Two times a day (BID) | ORAL | 0 refills | Status: DC

## 2017-01-19 MED ORDER — HYDROCODONE-ACETAMINOPHEN 5-325 MG PO TABS: 1.0000 | ORAL_TABLET | Freq: Every day | ORAL | 0 refills | Status: DC | PRN

## 2017-01-19 MED FILL — AMOX TR-K CLV 875-125 MG TA: 875-125 | 10 days supply | Qty: 20 | Fill #0

## 2017-01-19 MED FILL — HYDROCODON-APAP 5-325: 5-325 | 30 days supply | Qty: 30 | Fill #0

## 2017-01-19 NOTE — Progress Notes (Signed)
Subjective:    Patient ID: Jamie Powell, male    DOB: [birth year], 36 y.o.   MRN: [id number]  HPI He is here for a physical exam.   Sinus infection:  His symptoms started several days ago.  He has nasal congestion with green mucus, right > left sinus pain, cough and nausea.  He denies fever, ear pain and sore throat.  He has had some sinus headaches.    chronic pain management:  Indication for chronic opioid: chronic right shoulder pain Medication and dose: vicodin 5-325 mg 1 tab daily as needed, does not use daily # pills per month: 30 - usually lasts a few months Last UDS date: 01/19/17 Pain contract signed (Y/N): 01/19/17 Date narcotic database last reviewed (include red flags): 01/19/17  Pain assessment:  Pain intensity: 6/10  When more severe Amount of pain relief with medication: make pain tolerable 2/10 Use of pain medications: as needed - in winter Side effects:   none Sleep:  good Mood:  Good - denies depression and anxiety Functional/social activities: continues to be active in home/work setting   Last took prescribed pain medication:  Alcohol use:  Occasional, no abuse Tobacco use:   none Street Drug use:   none   Medications and allergies reviewed with patient and updated if appropriate.  Patient Active Problem List   Diagnosis Date Noted  . Encounter for pain management 01/19/2017  . Yellow eyes 01/04/2017  . Cluster headaches 01/04/2017  . Instability of right shoulder joint 05/27/2016  . Nonallopathic lesion of thoracic region 05/27/2016  . Nonallopathic lesion of cervical region 05/27/2016  . Nonallopathic lesion of rib cage 05/27/2016  . Chronic right shoulder pain 01/21/2016  . GERD (gastroesophageal reflux disease) 01/21/2016  . Headache 01/21/2016    Current Outpatient Medications on File Prior to Visit  Medication Sig Dispense Refill  . HYDROcodone-acetaminophen (NORCO/VICODIN) 5-325 MG tablet Take 1 tablet daily as needed by mouth for  moderate pain. 5 tablet 0   No current facility-administered medications on file prior to visit.     Past Medical History:  Diagnosis Date  . GERD (gastroesophageal reflux disease)     Past Surgical History:  Procedure Laterality Date  . SHOULDER SURGERY      Social History   Socioeconomic History  . Marital status: Married    Spouse name: Jamie MuirJamie  . Number of children: None  . Years of education: None  . Highest education level: None  Social Needs  . Financial resource strain: None  . Food insecurity - worry: None  . Food insecurity - inability: None  . Transportation needs - medical: None  . Transportation needs - non-medical: None  Occupational History  . None  Tobacco Use  . Smoking status: Former Games developermoker  . Smokeless tobacco: Current User  Substance and Sexual Activity  . Alcohol use: Yes    Comment: occasional  . Drug use: No  . Sexual activity: None  Other Topics Concern  . None  Social History Financial traderarrative   mechanic    Family History  Problem Relation Age of Onset  . Hypertension Maternal Grandfather   . Heart disease Maternal Grandfather   . Cancer Paternal Grandmother        pancreatic cancer  . Stroke Paternal Grandfather   . Diverticulitis Father     Review of Systems  Constitutional: Negative for chills and fever.  HENT: Positive for congestion and sinus pain. Negative for ear pain and sore throat (had it last week).  Respiratory: Positive for cough. Negative for shortness of breath and wheezing.   Cardiovascular: Negative for chest pain, palpitations and leg swelling.  Gastrointestinal: Positive for nausea (with sinus infection). Negative for abdominal pain, blood in stool, constipation and diarrhea.  Genitourinary: Negative for dysuria and hematuria.  Musculoskeletal: Positive for arthralgias.  Skin: Negative for color change and rash.  Neurological: Positive for headaches (sinus). Negative for dizziness and light-headedness.    Psychiatric/Behavioral: Negative for dysphoric mood. The patient is not nervous/anxious.        Objective:   Vitals:   01/19/17 1603  BP: 114/82  Pulse: 74  Resp: 16  Temp: 98.5 F (36.9 C)  SpO2: 98%   Filed Weights   01/19/17 1603  Weight: 223 lb (101.2 kg)   Body mass index is 30.24 kg/m.  Wt Readings from Last 3 Encounters:  01/19/17 223 lb (101.2 kg)  01/04/17 226 lb (102.5 kg)  05/27/16 240 lb 3.2 oz (109 kg)     Physical Exam Constitutional: He appears well-developed and well-nourished. No distress.  HENT:  Head: Normocephalic and atraumatic.  Right Ear: External ear normal.  Left Ear: External ear normal.  Mouth/Throat: Oropharynx is clear and moist.  Normal ear canals and TM b/l  Eyes: Conjunctivae and EOM are normal.  Neck: Neck supple. No tracheal deviation present. No thyromegaly present.  No carotid bruit  Cardiovascular: Normal rate, regular rhythm, normal heart sounds and intact distal pulses.   No murmur heard. Pulmonary/Chest: Effort normal and breath sounds normal. No respiratory distress. He has no wheezes. He has no rales.  Abdominal: Soft. He exhibits no distension. There is no tenderness.  Genitourinary: deferred  Musculoskeletal: He exhibits no edema.  Lymphadenopathy:   He has no cervical adenopathy.  Skin: Skin is warm and dry. He is not diaphoretic.  Psychiatric: He has a normal mood and affect. His behavior is normal.         Assessment & Plan:   Physical exam: Screening blood work  ordered Immunizations  Up to date  Exercise  Active, not exercising regularly Weight  overweight Skin no concerns Substance abuse   none  See Problem List for Assessment and Plan of chronic medical problems.   FU in 6 months

## 2017-01-19 NOTE — Patient Instructions (Addendum)
  Test(s) ordered today. Your results will be released to MyChart (or called to you) after review, usually within 72hours after test completion. If any changes need to be made, you will be notified at that same time.  All other Health Maintenance issues reviewed.   All recommended immunizations and age-appropriate screenings are up-to-date or discussed.  No immunizations administered today.   Medications reviewed and updated.  Changes include trying B12 1000 mcg and vitamin D 1000 units a day for 4-6 weeks.   Your prescription(s) have been submitted to your pharmacy. Please take as directed and contact our office if you believe you are having problem(s) with the medication(s).   Please followup in 6 months

## 2017-01-19 NOTE — Assessment & Plan Note (Signed)
Indication for chronic opioid: chronic right shoulder pain Medication and dose: vicodin 5-325 mg 1 tab daily as needed, does not use daily # pills per month: 30 - usually lasts a few months Last UDS date: 01/19/17 Pain contract signed (Y/N): 01/19/17 Date narcotic database last reviewed (include red flags): 01/19/17

## 2017-01-19 NOTE — Assessment & Plan Note (Signed)
Likely bacterial Start augmentin Flonase, otc cold medications Call if no improvement

## 2017-01-19 NOTE — Assessment & Plan Note (Signed)
Check lipid panel  Regular exercise and healthy diet encouraged  

## 2017-01-19 NOTE — Assessment & Plan Note (Signed)
Has had several steroid injections, has seen ortho and sports med Chronic pain - worse in cold weather Takes hydrocodone as needed

## 2017-01-20 LAB — PAIN MGMT, FENTANYL,W/CONF,W/MEDMATCH,URINE: Fentanyl: NEGATIVE ng/mL (ref <0.5)

## 2017-04-25 ENCOUNTER — Encounter: Payer: Self-pay | Admitting: Internal Medicine

## 2017-04-25 ENCOUNTER — Ambulatory Visit (INDEPENDENT_AMBULATORY_CARE_PROVIDER_SITE_OTHER): Payer: Managed Care, Other (non HMO) | Admitting: Internal Medicine

## 2017-04-25 VITALS — BP 112/76 | HR 72 | Temp 98.3°F | Resp 16 | Wt 230.0 lb

## 2017-04-25 DIAGNOSIS — M25531 Pain in right wrist: Secondary | ICD-10-CM

## 2017-04-25 DIAGNOSIS — R21 Rash and other nonspecific skin eruption: Secondary | ICD-10-CM

## 2017-04-25 DIAGNOSIS — M25511 Pain in right shoulder: Secondary | ICD-10-CM

## 2017-04-25 DIAGNOSIS — R52 Pain, unspecified: Secondary | ICD-10-CM

## 2017-04-25 DIAGNOSIS — G8929 Other chronic pain: Secondary | ICD-10-CM

## 2017-04-25 MED ORDER — TRIAMCINOLONE ACETONIDE 0.5 % EX OINT: 1.0000 "application " | TOPICAL_OINTMENT | Freq: Two times a day (BID) | CUTANEOUS | 0 refills | Status: DC

## 2017-04-25 MED ORDER — HYDROCODONE-ACETAMINOPHEN 5-325 MG PO TABS: 1.0000 | ORAL_TABLET | Freq: Every day | ORAL | 0 refills | Status: DC | PRN

## 2017-04-25 MED FILL — HYDROCODON-APAP 5-325: 5-325 | 30 days supply | Qty: 30 | Fill #0

## 2017-04-25 MED FILL — TRIAMCINOLONE 0.5% OINTMENT: 0.5 | 15 days supply | Qty: 30 | Fill #0

## 2017-04-25 NOTE — Assessment & Plan Note (Signed)
Possibly from ganglion cyst Size has decreased Deferred referral today - will monitor and let me know if it is not getting better - then will refer to sports/ortho

## 2017-04-25 NOTE — Progress Notes (Signed)
Subjective:    Patient ID: Jamie Powell, male    DOB: Jan 15, 1981, 37 y.o.   MRN: [id number]  HPI The patient is here for follow up for chronic pain management.  Indication for chronic opioid: chronic right shoulder pain  Medication and dose: vicodin 5-325 mg daily prn  # pills per month: 30 Last UDS date: 01/19/17 Pain contract signed (Y/N): 01/19/17 Date narcotic database last reviewed (include red flags): 04/25/17  Pain assessment:  Pain intensity:    3-4/10  now Amount of pain relief with medication: dulls the pain to 1-2/10 and allows him to work Use of pain medications:  Uses daily prn Side effects: none Sleep:  Toss and turns- shoulder wakes him up  Mood:  Denies depression and anxiety  Functional/social activities: continues to be active in home/work setting   Last took prescribed pain medication:  January Alcohol use: social Tobacco use:   yes Street Drug use:   no    Right knee, foot - areas of redness/dryness - itch.  Tried cortisone cream and an anti-fungal cream and they have helped, but rash has not gone away completely.  At work he is often in water and his feet can stay moist and he was concerned about a fungal infection.  They do itch, but that has improved.   Right wrist pain:  He may have done something at work.  somedays it hurts.  He has a bump that appeared on the lateral anterior wrist that is tender.  It has decreased in size.  It is painful around the wrist.  He denies swelling.  The pain radiates up to the elbow sometimes.    Medications and allergies reviewed with patient and updated if appropriate.  Patient Active Problem List   Diagnosis Date Noted  . Encounter for pain management 01/19/2017  . Hyperlipidemia 01/19/2017  . Acute non-recurrent maxillary sinusitis 01/19/2017  . Yellow eyes 01/04/2017  . Cluster headaches 01/04/2017  . Instability of right shoulder joint 05/27/2016  . Nonallopathic lesion of thoracic region 05/27/2016  .  Nonallopathic lesion of cervical region 05/27/2016  . Nonallopathic lesion of rib cage 05/27/2016  . Chronic right shoulder pain 01/21/2016  . GERD (gastroesophageal reflux disease) 01/21/2016  . Headache 01/21/2016    Current Outpatient Medications on File Prior to Visit  Medication Sig Dispense Refill  . HYDROcodone-acetaminophen (NORCO/VICODIN) 5-325 MG tablet Take 1 tablet by mouth daily as needed for moderate pain. 30 tablet 0   No current facility-administered medications on file prior to visit.     Past Medical History:  Diagnosis Date  . GERD (gastroesophageal reflux disease)     Past Surgical History:  Procedure Laterality Date  . SHOULDER SURGERY      Social History   Socioeconomic History  . Marital status: Married    Spouse name: [name]  . Number of children: None  . Years of education: None  . Highest education level: None  Social Needs  . Financial resource strain: None  . Food insecurity - worry: None  . Food insecurity - inability: None  . Transportation needs - medical: None  . Transportation needs - non-medical: None  Occupational History  . None  Tobacco Use  . Smoking status: Former Games developer  . Smokeless tobacco: Current User  Substance and Sexual Activity  . Alcohol use: Yes    Comment: occasional  . Drug use: No  . Sexual activity: None  Other Topics Concern  . None  Social History Narrative  Curatormechanic    Family History  Problem Relation Age of Onset  . Hypertension Maternal Grandfather   . Heart disease Maternal Grandfather   . Cancer Paternal Grandmother        pancreatic cancer  . Stroke Paternal Grandfather   . Diverticulitis Father     Review of Systems  Constitutional: Negative for chills and fever.  Musculoskeletal: Positive for arthralgias. Negative for joint swelling.  Skin: Positive for color change and rash. Negative for wound.  Neurological: Negative for weakness and numbness.       Objective:   Vitals:    04/25/17 0930  BP: 112/76  Pulse: 72  Resp: 16  Temp: 98.3 F (36.8 C)  SpO2: 98%   Wt Readings from Last 3 Encounters:  04/25/17 230 lb (104.3 kg)  01/19/17 223 lb (101.2 kg)  01/04/17 226 lb (102.5 kg)   Body mass index is 31.19 kg/m.   Physical Exam  Constitutional: He appears well-developed and well-nourished. No distress.  HENT:  Head: Normocephalic and atraumatic.  Musculoskeletal: He exhibits tenderness (posterior aspect of right shoulder with palpation and movement; right wrisrt - ganglion cyst anterior/lateral aspect of wrist). He exhibits no edema.  Neurological:  Normal sensation and strength b/l UE  Skin: Rash (right knee - dry, erythematous patch; mildly erythematous patch on right dorsal foot and left medial ankle) noted. He is not diaphoretic.           Assessment & Plan:    See Problem List for Assessment and Plan of chronic medical problems.

## 2017-04-25 NOTE — Assessment & Plan Note (Addendum)
Indication for chronic opioid: chronic right shoulder pain  Medication and dose: vicodin 5-325 mg daily prn  # pills per month: 30 Last UDS date: 01/19/17 Pain contract signed (Y/N): 01/19/17 Date narcotic database last reviewed (include red flags): 04/25/17, no red flags  Pain controlled, continue vicodin daily prn in winter months Follow up in 3 months

## 2017-04-25 NOTE — Assessment & Plan Note (Signed)
Rash on b/l feet and right knee Right knee rash looks like eczema - trial of triamcinolone cream Rash on feet  - ? Eczema or fungal Will try triamcinolone - if not effective will try anti-fungal again for 4-6 weeks

## 2017-04-25 NOTE — Assessment & Plan Note (Signed)
Pain controlled with pain medication Chronic in nature  Will hopefully be able to decrease medication use in warmer months Continue vicodin daily prn

## 2017-04-25 NOTE — Patient Instructions (Addendum)
  Medications reviewed and updated.  Changes include trying a steroid cream on the affected skin areas.  Call if this does not help.  Your pain medication was renewed.    Your prescription(s) have been submitted to your pharmacy. Please take as directed and contact our office if you believe you are having problem(s) with the medication(s).  Please followup in 3 months

## 2017-07-12 ENCOUNTER — Telehealth: Payer: Self-pay | Admitting: Internal Medicine

## 2017-07-12 MED ORDER — TRIAMCINOLONE ACETONIDE 0.5 % EX OINT: 1.0000 "application " | TOPICAL_OINTMENT | Freq: Two times a day (BID) | CUTANEOUS | 3 refills | Status: DC

## 2017-07-12 MED FILL — TRIAMCINOLONE 0.5% OINTMENT: 0.5 | 15 days supply | Qty: 30 | Fill #0

## 2017-07-12 NOTE — Telephone Encounter (Signed)
triamcinolone ointment (KENALOG) 0.5 %  [facility] - [place], [place] - [address] [place] [phone] (Phone) [phone] (Fax)

## 2017-08-23 NOTE — Progress Notes (Signed)
Subjective:    Patient ID: Jamie Powell, male    DOB: [birth year], 37 y.o.   MRN: [id number]  HPI The patient is here for follow up for chronic pain management.  He feels his shoulder pain is worse.  He has more pain in the back of the shoulder and sometimes on the top of the shoulder and into the collarbone.  This is not new.  His previous pain was primarily in the anterior shoulder.  He is waking up more at night because of the pain.  He uses the pain medication infrequently.  He has had prior laparoscopic surgeries and has not seen orthopedics in a while.  He is concerned that eventually he may not be able to work or he may injure his shoulder on the job.   Indication for chronic opioid: chronic right shoulder pain Medication and dose: vicodin 5-325 mg 1 tab daily prn # pills per month: 30 -  Last a few months Last UDS date: 01/19/17 Pain contract signed (Y/N): 01/19/17 Date narcotic database last reviewed (include red flags): 08/24/17, no red flags  Pain assessment:  Pain intensity: 2-3/10 at rest, often 8-9/10 with activity Amount of pain relief with medication: dulls pain Use of pain medications:   Takes them only as needed Side effects:  none Sleep: fair - shoulder wakes him up Mood: denies depression, anxiety Functional/social activities: continues to be active in home and work setting   Last took prescribed pain medication: two months ago Alcohol use:  social Tobacco use:   Yes, dip Street Drug use:   no     Medications and allergies reviewed with patient and updated if appropriate.  Patient Active Problem List   Diagnosis Date Noted  . Right wrist pain 04/25/2017  . Rash and nonspecific skin eruption 04/25/2017  . Encounter for pain management 01/19/2017  . Hyperlipidemia 01/19/2017  . Cluster headaches 01/04/2017  . Instability of right shoulder joint 05/27/2016  . Nonallopathic lesion of thoracic region 05/27/2016  . Nonallopathic lesion of cervical region  05/27/2016  . Nonallopathic lesion of rib cage 05/27/2016  . Chronic right shoulder pain 01/21/2016  . GERD (gastroesophageal reflux disease) 01/21/2016  . Headache 01/21/2016    Current Outpatient Medications on File Prior to Visit  Medication Sig Dispense Refill  . HYDROcodone-acetaminophen (NORCO/VICODIN) 5-325 MG tablet Take 1 tablet by mouth daily as needed for moderate pain. 30 tablet 0  . triamcinolone ointment (KENALOG) 0.5 % Apply 1 application topically 2 (two) times daily. 30 g 3   No current facility-administered medications on file prior to visit.     Past Medical History:  Diagnosis Date  . GERD (gastroesophageal reflux disease)     Past Surgical History:  Procedure Laterality Date  . SHOULDER SURGERY      Social History   Socioeconomic History  . Marital status: Married    Spouse name: [name]  . Number of children: Not on file  . Years of education: Not on file  . Highest education level: Not on file  Occupational History  . Not on file  Social Needs  . Financial resource strain: Not on file  . Food insecurity:    Worry: Not on file    Inability: Not on file  . Transportation needs:    Medical: Not on file    Non-medical: Not on file  Tobacco Use  . Smoking status: Former Games developer  . Smokeless tobacco: Current User  Substance and Sexual Activity  . Alcohol use:  Yes    Comment: occasional  . Drug use: No  . Sexual activity: Not on file  Lifestyle  . Physical activity:    Days per week: Not on file    Minutes per session: Not on file  . Stress: Not on file  Relationships  . Social connections:    Talks on phone: Not on file    Gets together: Not on file    Attends religious service: Not on file    Active member of club or organization: Not on file    Attends meetings of clubs or organizations: Not on file    Relationship status: Not on file  Other Topics Concern  . Not on file  Social History Narrative   Curatormechanic    Family History    Problem Relation Age of Onset  . Hypertension Maternal Grandfather   . Heart disease Maternal Grandfather   . Cancer Paternal Grandmother        pancreatic cancer  . Stroke Paternal Grandfather   . Diverticulitis Father     Review of Systems  Constitutional: Negative for fever.  Musculoskeletal: Positive for arthralgias. Negative for joint swelling.  Skin: Negative for color change.       Objective:   Vitals:   08/24/17 1533  BP: 122/80  Pulse: 76  Temp: 98.3 F (36.8 C)  SpO2: 97%   BP Readings from Last 3 Encounters:  08/24/17 122/80  04/25/17 112/76  01/19/17 114/82   Wt Readings from Last 3 Encounters:  08/24/17 210 lb 14.4 oz (95.7 kg)  04/25/17 230 lb (104.3 kg)  01/19/17 223 lb (101.2 kg)   Body mass index is 28.6 kg/m.   Physical Exam    A Right Shoulder exam was performed.   SWELLING: none  EFFUSION: no  WARMTH: no warmth  TENDERNESS: no tenderness on throughout shoulder joint but with palpitation on top of shoulder he has a stinging sensation in his elbow ROM: full ROM with pain with certain movements NEUROLOGICAL EXAM: normal sensation and strength  PULSES: normal        Assessment & Plan:    See Problem List for Assessment and Plan of chronic medical problems.

## 2017-08-24 ENCOUNTER — Ambulatory Visit (INDEPENDENT_AMBULATORY_CARE_PROVIDER_SITE_OTHER): Payer: Managed Care, Other (non HMO) | Admitting: Internal Medicine

## 2017-08-24 ENCOUNTER — Encounter: Payer: Self-pay | Admitting: Internal Medicine

## 2017-08-24 VITALS — BP 122/80 | HR 76 | Temp 98.3°F | Wt 210.9 lb

## 2017-08-24 DIAGNOSIS — G8929 Other chronic pain: Secondary | ICD-10-CM

## 2017-08-24 DIAGNOSIS — M25511 Pain in right shoulder: Secondary | ICD-10-CM

## 2017-08-24 DIAGNOSIS — R52 Pain, unspecified: Secondary | ICD-10-CM | POA: Diagnosis not present

## 2017-08-24 MED ORDER — HYDROCODONE-ACETAMINOPHEN 5-325 MG PO TABS: 1.0000 | ORAL_TABLET | Freq: Every day | ORAL | 0 refills | Status: DC | PRN

## 2017-08-24 MED FILL — HYDROCODON-APAP 5-325: 5-325 | 30 days supply | Qty: 30 | Fill #0

## 2017-08-24 NOTE — Patient Instructions (Addendum)
Your pain medication was sent to your pharmacy.      A referral was ordered for orthopedics.    Follow up in 6 months

## 2017-08-24 NOTE — Assessment & Plan Note (Signed)
Chronic shoulder pain that has gotten worse recently Has had more than one surgery in the past Taking hydrocodone only as needed to help him work-medication does help and allows him to continue to be active at work Will refer to orthopedics for further evaluation of worsening pain that is of different quality Follow-up in 6 months given the infrequent use of pain medication

## 2017-08-24 NOTE — Assessment & Plan Note (Signed)
Indication for chronic opioid: chronic right shoulder pain Medication and dose: vicodin 5-325 mg 1 tab daily prn # pills per month: 30 -  Last a few months Last UDS date: 01/19/17 Pain contract signed (Y/N): 01/19/17 Date narcotic database last reviewed (include red flags): 08/24/17, no red flags  Taking the medication only as needed and it is providing some pain relief, but overall pain has increased We will have him see orthopedics since since he has not seen anyone in a while for reevaluation Continue Vicodin as needed only Given his infrequent use of the pain medication we will have him follow-up in 6 months, sooner if needed

## 2017-09-13 ENCOUNTER — Encounter: Payer: Self-pay | Admitting: Internal Medicine

## 2017-09-13 ENCOUNTER — Ambulatory Visit (INDEPENDENT_AMBULATORY_CARE_PROVIDER_SITE_OTHER): Payer: Managed Care, Other (non HMO) | Admitting: Internal Medicine

## 2017-09-13 DIAGNOSIS — J019 Acute sinusitis, unspecified: Secondary | ICD-10-CM | POA: Insufficient documentation

## 2017-09-13 DIAGNOSIS — J0110 Acute frontal sinusitis, unspecified: Secondary | ICD-10-CM | POA: Diagnosis not present

## 2017-09-13 DIAGNOSIS — J329 Chronic sinusitis, unspecified: Secondary | ICD-10-CM | POA: Insufficient documentation

## 2017-09-13 MED ORDER — LIDOCAINE VISCOUS HCL 2 % MT SOLN: 15.0000 mL | OROMUCOSAL | 2 refills | Status: DC | PRN

## 2017-09-13 MED ORDER — AMOXICILLIN-POT CLAVULANATE 875-125 MG PO TABS: 1.0000 | ORAL_TABLET | Freq: Two times a day (BID) | ORAL | 0 refills | Status: DC

## 2017-09-13 MED FILL — LIDOCAINE 2% VISCOUS SOLN: 2 | 1 days supply | Qty: 100 | Fill #0

## 2017-09-13 MED FILL — AMOX-CLAV 875-125 MG TABLET: 875-125 | 10 days supply | Qty: 20 | Fill #0

## 2017-09-13 NOTE — Progress Notes (Signed)
   Subjective:    Patient ID: Jamie Powell, male    DOB: Jul 07, 1980, 37 y.o.   MRN: [id number]  HPI The patient is a 37 YO man coming in for chills, cough, sinus problems for about 9-10 days now. Started with chest cold symptoms. Was taking claritin-d over the counter which did not help. Is having some tongue burning as if he ate something too hot but does not recall any injury. Did have some chills which are ongoing. He is overall worsening. Has had several sinus infections in the past which started this way. Does have some sinus pressure and pain. Has some ear pain and pressure. Denies hearing change. Is having cough which is non-productive.   Review of Systems  Constitutional: Positive for activity change, appetite change and chills. Negative for fatigue, fever and unexpected weight change.  HENT: Positive for congestion, postnasal drip, rhinorrhea and sinus pressure. Negative for ear discharge, ear pain, sinus pain, sneezing, sore throat, tinnitus, trouble swallowing and voice change.        Tongue pain  Eyes: Negative.   Respiratory: Positive for cough. Negative for chest tightness, shortness of breath and wheezing.   Cardiovascular: Negative.   Gastrointestinal: Negative.   Musculoskeletal: Positive for myalgias.  Neurological: Negative.       Objective:   Physical Exam  Constitutional: He is oriented to person, place, and time. He appears well-developed and well-nourished.  HENT:  Head: Normocephalic and atraumatic.  Oropharynx with redness and clear drainage, nose with swollen turbinates, TMs normal bilaterally, frontal sinus pain  Eyes: EOM are normal.  Neck: Normal range of motion. No thyromegaly present.  Cardiovascular: Normal rate and regular rhythm.  Pulmonary/Chest: Effort normal and breath sounds normal. No respiratory distress. He has no wheezes. He has no rales.  Mild rhonchi which partially clear with coughing  Abdominal: Soft.  Musculoskeletal: He exhibits  tenderness.  Lymphadenopathy:    He has no cervical adenopathy.  Neurological: He is alert and oriented to person, place, and time.  Skin: Skin is warm and dry.   Vitals:   09/13/17 0944  BP: 104/70  Pulse: 78  Temp: 98.3 F (36.8 C)  TempSrc: Oral  SpO2: 97%  Weight: 217 lb (98.4 kg)  Height: 6' (1.829 m)      Assessment & Plan:

## 2017-09-13 NOTE — Assessment & Plan Note (Signed)
Rx for lidocaine liquid to help with eating. We talked about how likely this will recover. Rx for augmentin for the sinus infection.

## 2017-09-13 NOTE — Patient Instructions (Addendum)
We have sent in the augmentin to take 1 pill twice a day for 10 days.  You can use the liquid lidocaine before meals and as needed for the pain.

## 2017-10-14 ENCOUNTER — Telehealth (INDEPENDENT_AMBULATORY_CARE_PROVIDER_SITE_OTHER): Payer: Self-pay | Admitting: Orthopedic Surgery

## 2017-10-14 NOTE — Telephone Encounter (Signed)
Patient called back to give you the fax number for [facility]. Their fax number is: Fax: [phone]

## 2017-10-14 NOTE — Telephone Encounter (Signed)
Authorization form faxed.

## 2017-10-19 ENCOUNTER — Encounter (INDEPENDENT_AMBULATORY_CARE_PROVIDER_SITE_OTHER): Payer: Self-pay | Admitting: Orthopedic Surgery

## 2017-10-19 ENCOUNTER — Ambulatory Visit (INDEPENDENT_AMBULATORY_CARE_PROVIDER_SITE_OTHER): Payer: Managed Care, Other (non HMO)

## 2017-10-19 ENCOUNTER — Ambulatory Visit (INDEPENDENT_AMBULATORY_CARE_PROVIDER_SITE_OTHER): Payer: Managed Care, Other (non HMO) | Admitting: Orthopedic Surgery

## 2017-10-19 VITALS — Ht 72.0 in | Wt 220.0 lb

## 2017-10-19 DIAGNOSIS — M25511 Pain in right shoulder: Secondary | ICD-10-CM

## 2017-10-19 DIAGNOSIS — G8929 Other chronic pain: Secondary | ICD-10-CM

## 2017-10-19 DIAGNOSIS — M25561 Pain in right knee: Secondary | ICD-10-CM | POA: Diagnosis not present

## 2017-10-19 MED ORDER — HYDROCODONE-ACETAMINOPHEN 5-325 MG PO TABS: 1.0000 | ORAL_TABLET | Freq: Every day | ORAL | 0 refills | Status: DC | PRN
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
## 2017-10-25 ENCOUNTER — Telehealth (INDEPENDENT_AMBULATORY_CARE_PROVIDER_SITE_OTHER): Payer: Self-pay | Admitting: Orthopedic Surgery

## 2017-10-25 DIAGNOSIS — G8929 Other chronic pain: Secondary | ICD-10-CM

## 2017-10-25 DIAGNOSIS — M25511 Pain in right shoulder: Principal | ICD-10-CM

## 2017-10-25 NOTE — Telephone Encounter (Signed)
Based off of last OV note, I did proceed with order for MR arthrogram of right shoulder. Tried calling patient to advised put order in. No answer. LMVM for him advising that someone would reach out to him once authorized by his insurance.

## 2017-10-25 NOTE — Telephone Encounter (Signed)
Patient called asked if he can be set up for an MRI on his right shoulder. The number to contact patient is [phone]

## 2017-10-26 ENCOUNTER — Ambulatory Visit (INDEPENDENT_AMBULATORY_CARE_PROVIDER_SITE_OTHER): Payer: Managed Care, Other (non HMO) | Admitting: Orthopedic Surgery

## 2017-11-17 ENCOUNTER — Ambulatory Visit
Admission: RE | Admit: 2017-11-17 | Discharge: 2017-11-17 | Disposition: A | Discharge status: Home / Self Care | Payer: Managed Care, Other (non HMO) | Source: Ambulatory Visit | Attending: Orthopedic Surgery | Admitting: Orthopedic Surgery

## 2017-11-17 DIAGNOSIS — G8929 Other chronic pain: Secondary | ICD-10-CM

## 2017-11-17 DIAGNOSIS — M25511 Pain in right shoulder: Principal | ICD-10-CM

## 2017-11-17 MED ORDER — IOPAMIDOL (ISOVUE-M 200) INJECTION 41%
17.0000 mL | Freq: Once | INTRAMUSCULAR | Status: AC
  Administered 2017-11-17: 17 mL via INTRA_ARTICULAR

## 2017-11-21 ENCOUNTER — Ambulatory Visit (INDEPENDENT_AMBULATORY_CARE_PROVIDER_SITE_OTHER): Payer: Managed Care, Other (non HMO) | Admitting: Orthopedic Surgery

## 2017-11-21 ENCOUNTER — Encounter (INDEPENDENT_AMBULATORY_CARE_PROVIDER_SITE_OTHER): Payer: Self-pay | Admitting: Orthopedic Surgery

## 2017-11-21 DIAGNOSIS — M25511 Pain in right shoulder: Secondary | ICD-10-CM

## 2017-11-21 DIAGNOSIS — G8929 Other chronic pain: Secondary | ICD-10-CM

## 2017-11-21 MED ORDER — HYDROCODONE-ACETAMINOPHEN 5-325 MG PO TABS: ORAL_TABLET | ORAL | 0 refills | Status: DC

## 2017-11-23 ENCOUNTER — Encounter (INDEPENDENT_AMBULATORY_CARE_PROVIDER_SITE_OTHER): Payer: Self-pay | Admitting: Orthopedic Surgery

## 2017-11-23 NOTE — Progress Notes (Signed)
Office Visit Note   Patient: Jamie Powell           Date of Birth: 1980-09-18           MRN: [id number] Visit Date: 11/21/2017 Requested by: Pincus Sanes, MD [address] [place], [place] [address] PCP: Pincus Sanes, MD  Subjective: Chief Complaint  Patient presents with  . Right Shoulder - Follow-up    HPI: Jamie Powell is a patient with right shoulder pain.  Since I seen him he had an MRI scan.  He has had multiple prior surgeries.  MRI scan is reviewed and he has mild arthritis but I do see what looks to be like a small chondral flap on that humeral head.  He has had prior distal clavicle excision and biceps tenodesis both of which look intact.  There is no rotator cuff tear.  Tramadol gives him nausea.  He is getting referred to pain management.  He is trying to complete some type of domestic project before proceeding with some other major life events.  He does typically take about 1 narcotic pain pill per day.  Last prescription was Norco which was prescribed about 40 days ago.              ROS: All systems reviewed are negative as they relate to the chief complaint within the history of present illness.  Patient denies  fevers or chills.   Assessment & Plan: Visit Diagnoses:  1. Chronic right shoulder pain     Plan: Impression is right shoulder pain with what looks to be symptomatic early arthritis in the shoulder.  Patient has very good functional range of motion.  I would refer him to pain management as there is nothing really surgical to be done with the shoulder at this time.  He is not really interested in any type of injections.  Refill Norco one time until his pain medicine management appointment.  I did write him a prescription which he could fill on 12/01/2017.  I will see him back as needed.  Follow-Up Instructions: Return if symptoms worsen or fail to improve.   Orders:  Orders Placed This Encounter  Procedures  . Ambulatory referral to Pain Clinic   Meds ordered  this encounter  Medications  . HYDROcodone-acetaminophen (NORCO/VICODIN) 5-325 MG tablet    Sig: 1 po q d prn pain  Ok to fill on 12/01/17    Dispense:  45 tablet    Refill:  0      Procedures: No procedures performed   Clinical Data: No additional findings.  Objective: Vital Signs: There were no vitals taken for this visit.  Physical Exam:   Constitutional: Patient appears well-developed HEENT:  Head: Normocephalic Eyes:EOM are normal Neck: Normal range of motion Cardiovascular: Normal rate Pulmonary/chest: Effort normal Neurologic: Patient is alert Skin: Skin is warm Psychiatric: Patient has normal mood and affect    Ortho Exam: Ortho exam demonstrates good cervical spine range of motion.  He has full shoulder range of motion with good rotator cuff strength.  I do not detect much in the way of coarse grinding or crepitus except with forward flexion and internal rotation at 90 degrees of abduction.  No real instability.  No apprehension or significant cuff weakness present.  Specialty Comments:  No specialty comments available.  Imaging: No results found.   PMFS History: Patient Active Problem List   Diagnosis Date Noted  . Sinusitis 09/13/2017  . Right wrist pain 04/25/2017  . Rash  and nonspecific skin eruption 04/25/2017  . Encounter for pain management 01/19/2017  . Hyperlipidemia 01/19/2017  . Cluster headaches 01/04/2017  . Instability of right shoulder joint 05/27/2016  . Nonallopathic lesion of thoracic region 05/27/2016  . Nonallopathic lesion of cervical region 05/27/2016  . Nonallopathic lesion of rib cage 05/27/2016  . Chronic right shoulder pain 01/21/2016  . GERD (gastroesophageal reflux disease) 01/21/2016  . Headache 01/21/2016   Past Medical History:  Diagnosis Date  . GERD (gastroesophageal reflux disease)     Family History  Problem Relation Age of Onset  . Hypertension Maternal Grandfather   . Heart disease Maternal Grandfather    . Cancer Paternal Grandmother        pancreatic cancer  . Stroke Paternal Grandfather   . Diverticulitis Father     Past Surgical History:  Procedure Laterality Date  . SHOULDER SURGERY     Social History   Occupational History  . Not on file  Tobacco Use  . Smoking status: Former Games developer  . Smokeless tobacco: Current User  Substance and Sexual Activity  . Alcohol use: Yes    Comment: occasional  . Drug use: No  . Sexual activity: Not on file

## 2017-11-25 MED FILL — TRIAMCINOLONE 0.5% OINTMENT: 0.5 | 15 days supply | Qty: 30 | Fill #1

## 2017-12-14 ENCOUNTER — Telehealth (INDEPENDENT_AMBULATORY_CARE_PROVIDER_SITE_OTHER): Payer: Self-pay

## 2017-12-14 ENCOUNTER — Encounter (INDEPENDENT_AMBULATORY_CARE_PROVIDER_SITE_OTHER): Payer: Self-pay | Admitting: Orthopedic Surgery

## 2017-12-14 ENCOUNTER — Ambulatory Visit (INDEPENDENT_AMBULATORY_CARE_PROVIDER_SITE_OTHER): Payer: Managed Care, Other (non HMO) | Admitting: Orthopedic Surgery

## 2017-12-14 DIAGNOSIS — G8929 Other chronic pain: Secondary | ICD-10-CM | POA: Diagnosis not present

## 2017-12-14 DIAGNOSIS — M7551 Bursitis of right shoulder: Secondary | ICD-10-CM

## 2017-12-14 DIAGNOSIS — M25511 Pain in right shoulder: Secondary | ICD-10-CM

## 2017-12-14 NOTE — Progress Notes (Signed)
Office Visit Note   Patient: Jamie Powell           Date of Birth: 1980-05-08           MRN: [id number] Visit Date: 12/14/2017 Requested by: Pincus Sanes, MD [address] [place], [place] [address] PCP: Pincus Sanes, MD  Subjective: Chief Complaint  Patient presents with  . Right Shoulder - Pain    HPI: Jamie Powell is a patient with right shoulder pain.  He states his pain is getting worse.  He is had multiple surgeries and MRI scanning subsequent to those surgeries shows no definitively operative pathology in the shoulder joint.  States he is having some pain that wakes him from sleep at night.  Biking is not been helpful.  He is working with Data processing manager.  He denies any neck pain or radicular symptoms.              ROS: All systems reviewed are negative as they relate to the chief complaint within the history of present illness.  Patient denies  fevers or chills.   Assessment & Plan: Visit Diagnoses: No diagnosis found.  Plan: Impression is right shoulder pain which may be bursitis related or may be from intra-articular pain generators.  I do not think he has a surgical problem in the shoulder.  He is had multiple prior surgeries done elsewhere.  I meant to put a subacromial injection into day and then I would like him to see Dr. Alvester Morin next week for right shoulder intra-articular injection.  I think that the intra-articular injection will likely give him more pain relief.  Note for no lifting greater than 15 pounds for 4 weeks due to right shoulder issues is provided.  I will see him back as needed  Follow-Up Instructions: No follow-ups on file.   Orders:  No orders of the defined types were placed in this encounter.  No orders of the defined types were placed in this encounter.     Procedures: Large Joint Inj: R subacromial bursa on 12/15/2017 9:19 AM Indications: diagnostic evaluation and pain Details: 18 G 1.5 in needle, posterior approach  Arthrogram:  No  Medications: 9 mL bupivacaine 0.5 %; 40 mg methylPREDNISolone acetate 40 MG/ML; 5 mL lidocaine 1 % Outcome: tolerated well, no immediate complications Procedure, treatment alternatives, risks and benefits explained, specific risks discussed. Consent was given by the patient. Immediately prior to procedure a time out was called to verify the correct patient, procedure, equipment, support staff and site/side marked as required. Patient was prepped and draped in the usual sterile fashion.       Clinical Data: No additional findings.  Objective: Vital Signs: There were no vitals taken for this visit.  Physical Exam:   Constitutional: Patient appears well-developed HEENT:  Head: Normocephalic Eyes:EOM are normal Neck: Normal range of motion Cardiovascular: Normal rate Pulmonary/chest: Effort normal Neurologic: Patient is alert Skin: Skin is warm Psychiatric: Patient has normal mood and affect    Ortho Exam: Ortho exam demonstrates good rotator cuff strength on the right with equivocal impingement signs.  No discrete AC joint tenderness is noted.  He localizes the pain posterior laterally.  Not much in the way of coarse grinding or crepitus with active or passive range of motion of the arm.  Less than a centimeter sulcus sign in about 1+ anterior posterior instability on the right-hand side.  Specialty Comments:  No specialty comments available.  Imaging: No results found.   PMFS History: Patient  Active Problem List   Diagnosis Date Noted  . Sinusitis 09/13/2017  . Right wrist pain 04/25/2017  . Rash and nonspecific skin eruption 04/25/2017  . Encounter for pain management 01/19/2017  . Hyperlipidemia 01/19/2017  . Cluster headaches 01/04/2017  . Instability of right shoulder joint 05/27/2016  . Nonallopathic lesion of thoracic region 05/27/2016  . Nonallopathic lesion of cervical region 05/27/2016  . Nonallopathic lesion of rib cage 05/27/2016  . Chronic right  shoulder pain 01/21/2016  . GERD (gastroesophageal reflux disease) 01/21/2016  . Headache 01/21/2016   Past Medical History:  Diagnosis Date  . GERD (gastroesophageal reflux disease)     Family History  Problem Relation Age of Onset  . Hypertension Maternal Grandfather   . Heart disease Maternal Grandfather   . Cancer Paternal Grandmother        pancreatic cancer  . Stroke Paternal Grandfather   . Diverticulitis Father     Past Surgical History:  Procedure Laterality Date  . SHOULDER SURGERY     Social History   Occupational History  . Not on file  Tobacco Use  . Smoking status: Former Games developer  . Smokeless tobacco: Current User  Substance and Sexual Activity  . Alcohol use: Yes    Comment: occasional  . Drug use: No  . Sexual activity: Not on file

## 2017-12-14 NOTE — Telephone Encounter (Signed)
Dr August Saucer saw patient today. If possible, he would like for him to have right shoulder injection done on 12/23/17. I have put order in.

## 2017-12-15 ENCOUNTER — Encounter (INDEPENDENT_AMBULATORY_CARE_PROVIDER_SITE_OTHER): Payer: Self-pay | Admitting: Orthopedic Surgery

## 2017-12-15 DIAGNOSIS — M7551 Bursitis of right shoulder: Secondary | ICD-10-CM

## 2017-12-15 MED ORDER — METHYLPREDNISOLONE ACETATE 40 MG/ML IJ SUSP
40.0000 mg | INTRAMUSCULAR | Status: AC | PRN
  Administered 2017-12-15: 40 mg via INTRA_ARTICULAR

## 2017-12-15 MED ORDER — LIDOCAINE HCL 1 % IJ SOLN
5.0000 mL | INTRAMUSCULAR | Status: AC | PRN
  Administered 2017-12-15: 5 mL

## 2017-12-15 MED ORDER — BUPIVACAINE HCL 0.5 % IJ SOLN
9.0000 mL | INTRAMUSCULAR | Status: AC | PRN
  Administered 2017-12-15: 9 mL via INTRA_ARTICULAR

## 2017-12-15 NOTE — Telephone Encounter (Signed)
Scheduled the patient for our only available appointment on 11/1 at 0845. I left a message asking patient to call me back to confirm.

## 2017-12-16 NOTE — Telephone Encounter (Signed)
Called patient again and left a message asking him to call me to confirm his appointment on 11/1. I advised that if I have not had confirmation by Monday I will not be able to hold the appointment any longer.

## 2017-12-23 ENCOUNTER — Ambulatory Visit (INDEPENDENT_AMBULATORY_CARE_PROVIDER_SITE_OTHER): Payer: Managed Care, Other (non HMO) | Admitting: Physical Medicine and Rehabilitation

## 2017-12-23 ENCOUNTER — Encounter (INDEPENDENT_AMBULATORY_CARE_PROVIDER_SITE_OTHER): Payer: Self-pay | Admitting: Physical Medicine and Rehabilitation

## 2017-12-23 ENCOUNTER — Ambulatory Visit (INDEPENDENT_AMBULATORY_CARE_PROVIDER_SITE_OTHER): Payer: Self-pay

## 2017-12-23 ENCOUNTER — Ambulatory Visit (INDEPENDENT_AMBULATORY_CARE_PROVIDER_SITE_OTHER): Payer: Managed Care, Other (non HMO)

## 2017-12-23 DIAGNOSIS — M25511 Pain in right shoulder: Secondary | ICD-10-CM

## 2017-12-23 DIAGNOSIS — Z23 Encounter for immunization: Secondary | ICD-10-CM

## 2017-12-23 DIAGNOSIS — G8929 Other chronic pain: Secondary | ICD-10-CM

## 2017-12-23 NOTE — Progress Notes (Signed)
Jamie Powell - 37 y.o. male MRN [id number]  Date of birth: [birth year]  Office Visit Note: Visit Date: 12/23/2017 PCP: Pincus Sanes, MD Referred by: Pincus Sanes, MD  Subjective: Chief Complaint  Patient presents with  . Right Shoulder - Pain   HPI: At the request of Dr. Burnard Bunting for diagnostic and hopefully therapeutic right intra-articular glenohumeral joint injection.  He has history of several years of worsening shoulder pain.  He has had prior rotator cuff surgery in 2012.  Gets worse with pulling pushing and rotating.  Rates his pain is 5 out of 10.  Subacromial injection by Dr. August Saucer was not very successful in relieving his symptoms.   ROS Otherwise per HPI.  Assessment & Plan: Visit Diagnoses:  1. Chronic right shoulder pain     Plan: Findings:  Patient did not seem to get much relief during the anesthetic phase of the injection.    Meds & Orders: No orders of the defined types were placed in this encounter.   Orders Placed This Encounter  Procedures  . Large Joint Inj  . XR C-ARM NO REPORT    Follow-up: Return if symptoms worsen or fail to improve.   Procedures: Large Joint Inj on 12/23/2017 9:19 AM Indications: pain and diagnostic evaluation Details: 22 G 3.5 in needle, fluoroscopy-guided anteromedial approach  Arthrogram: No  Medications: 3 mL bupivacaine 0.5 %; 80 mg triamcinolone acetonide 40 MG/ML Outcome: tolerated well, no immediate complications  There was excellent flow of contrast producing a partial arthrogram of the glenohumeral joint. The patient did not have much relief of symptoms during the anesthetic phase of the injection. Procedure, treatment alternatives, risks and benefits explained, specific risks discussed. Consent was given by the patient. Immediately prior to procedure a time out was called to verify the correct patient, procedure, equipment, support staff and site/side marked as required. Patient was prepped and draped in the  usual sterile fashion.      No notes on file   Clinical History: No specialty comments available.  He reports that he has quit smoking. He uses smokeless tobacco. No results for input(s): HGBA1C, LABURIC in the last 8760 hours.  Objective:  VS:  HT:    WT:   BMI:     BP:   HR: bpm  TEMP: ( )  RESP:  Physical Exam  Ortho Exam Imaging: No results found.  Past Medical/Family/Surgical/Social History: Medications & Allergies reviewed per EMR Patient Active Problem List   Diagnosis Date Noted  . Sinusitis 09/13/2017  . Right wrist pain 04/25/2017  . Rash and nonspecific skin eruption 04/25/2017  . Encounter for pain management 01/19/2017  . Hyperlipidemia 01/19/2017  . Cluster headaches 01/04/2017  . Instability of right shoulder joint 05/27/2016  . Nonallopathic lesion of thoracic region 05/27/2016  . Nonallopathic lesion of cervical region 05/27/2016  . Nonallopathic lesion of rib cage 05/27/2016  . Chronic right shoulder pain 01/21/2016  . GERD (gastroesophageal reflux disease) 01/21/2016  . Headache 01/21/2016   Past Medical History:  Diagnosis Date  . GERD (gastroesophageal reflux disease)    Family History  Problem Relation Age of Onset  . Hypertension Maternal Grandfather   . Heart disease Maternal Grandfather   . Cancer Paternal Grandmother        pancreatic cancer  . Stroke Paternal Grandfather   . Diverticulitis Father    Past Surgical History:  Procedure Laterality Date  . SHOULDER SURGERY     Social History   Occupational  History  . Not on file  Tobacco Use  . Smoking status: Former Games developer  . Smokeless tobacco: Current User  Substance and Sexual Activity  . Alcohol use: Yes    Comment: occasional  . Drug use: No  . Sexual activity: Not on file

## 2017-12-23 NOTE — Progress Notes (Signed)
    Numeric Pain Rating Scale and Functional Assessment Average Pain 5   In the last MONTH (on 0-10 scale) has pain interfered with the following?  1. General activity like being  able to carry out your everyday physical activities such as walking, climbing stairs, carrying groceries, or moving a chair?  Rating(3)   -Dye Allergies.  

## 2017-12-23 NOTE — Patient Instructions (Signed)

## 2017-12-26 MED ORDER — BUPIVACAINE HCL 0.5 % IJ SOLN
3.0000 mL | INTRAMUSCULAR | Status: AC | PRN
  Administered 2017-12-23: 3 mL via INTRA_ARTICULAR

## 2017-12-26 MED ORDER — TRIAMCINOLONE ACETONIDE 40 MG/ML IJ SUSP
80.0000 mg | INTRAMUSCULAR | Status: AC | PRN
  Administered 2017-12-23: 80 mg via INTRA_ARTICULAR

## 2018-01-11 ENCOUNTER — Encounter (INDEPENDENT_AMBULATORY_CARE_PROVIDER_SITE_OTHER): Payer: Self-pay | Admitting: Orthopedic Surgery

## 2018-01-11 ENCOUNTER — Ambulatory Visit (INDEPENDENT_AMBULATORY_CARE_PROVIDER_SITE_OTHER): Payer: Managed Care, Other (non HMO) | Admitting: Orthopedic Surgery

## 2018-01-11 DIAGNOSIS — M25511 Pain in right shoulder: Secondary | ICD-10-CM | POA: Diagnosis not present

## 2018-01-11 DIAGNOSIS — G8929 Other chronic pain: Secondary | ICD-10-CM

## 2018-01-11 NOTE — Progress Notes (Signed)
Office Visit Note   Patient: Jamie BarrioDaniel Powell           Date of Birth: 07-Nov-1980           MRN: [id number] Visit Date: 01/11/2018 Requested by: Pincus SanesBurns, Stacy J, MD [address] [place], [place] [id number] PCP: Pincus SanesBurns, Stacy J, MD  Subjective: Chief Complaint  Patient presents with  . Right Shoulder - Follow-up    HPI: Jamie BoomDaniel is a patient with right shoulder pain.  Had intra-articular injection with Dr. Ezzard StandingNewman 12/23/2017 which helped him for about 5 days but it was only marginal help.  Reports continued pain.  Had 3 surgeries and on MRI scanning does not have an operative problem in the shoulder.  Denies much in the way of neck pain or radicular symptoms.  He does have to do a lot of pushing and pulling.  He is currently on light duty with no lifting more than 15 pounds.              ROS: All systems reviewed are negative as they relate to the chief complaint within the history of present illness.  Patient denies  fevers or chills.   Assessment & Plan: Visit Diagnoses:  1. Chronic right shoulder pain     Plan: Impression is right shoulder pain with no clear operative etiology and failure of conservative management.  I do not think that there is a surgical indication in the shoulder at this time.  Discussed nerve study and MRI scan of the cervical spine and that is not indicated at this time according to him.  I am going to continue his current duty restrictions for 4 weeks and then regular duty.  Follow-up with me as needed.  Follow-Up Instructions: Return if symptoms worsen or fail to improve.   Orders:  No orders of the defined types were placed in this encounter.  No orders of the defined types were placed in this encounter.     Procedures: No procedures performed   Clinical Data: No additional findings.  Objective: Vital Signs: There were no vitals taken for this visit.  Physical Exam:   Constitutional: Patient appears well-developed HEENT:  Head:  Normocephalic Eyes:EOM are normal Neck: Normal range of motion Cardiovascular: Normal rate Pulmonary/chest: Effort normal Neurologic: Patient is alert Skin: Skin is warm Psychiatric: Patient has normal mood and affect    Ortho Exam: Ortho exam demonstrates good cervical spine range of motion with 5 out of 5 grip EPL FPL interosseous wrist flexion extension bicep triceps and deltoid strength.  He has good shoulder range of motion and some coarseness with passive range of motion internal and external rotation.  Hard to say if this is some type of chondral defect but I do not really see anything definitive on the MRI scan.  If there were chondral defect I do not think that it would be amenable to any surgical treatment.  I will see him back as needed  Specialty Comments:  No specialty comments available.  Imaging: No results found.   PMFS History: Patient Active Problem List   Diagnosis Date Noted  . Sinusitis 09/13/2017  . Right wrist pain 04/25/2017  . Rash and nonspecific skin eruption 04/25/2017  . Encounter for pain management 01/19/2017  . Hyperlipidemia 01/19/2017  . Cluster headaches 01/04/2017  . Instability of right shoulder joint 05/27/2016  . Nonallopathic lesion of thoracic region 05/27/2016  . Nonallopathic lesion of cervical region 05/27/2016  . Nonallopathic lesion of rib cage 05/27/2016  .  Chronic right shoulder pain 01/21/2016  . GERD (gastroesophageal reflux disease) 01/21/2016  . Headache 01/21/2016   Past Medical History:  Diagnosis Date  . GERD (gastroesophageal reflux disease)     Family History  Problem Relation Age of Onset  . Hypertension Maternal Grandfather   . Heart disease Maternal Grandfather   . Cancer Paternal Grandmother        pancreatic cancer  . Stroke Paternal Grandfather   . Diverticulitis Father     Past Surgical History:  Procedure Laterality Date  . SHOULDER SURGERY     Social History   Occupational History  . Not on file   Tobacco Use  . Smoking status: Former Games developer  . Smokeless tobacco: Current User  Substance and Sexual Activity  . Alcohol use: Yes    Comment: occasional  . Drug use: No  . Sexual activity: Not on file

## 2018-01-23 NOTE — Progress Notes (Signed)
Subjective:    Patient ID: Jamie Powell, male    DOB: 1981/02/12, 37 y.o.   MRN: [id number]  HPI He is here for a physical exam.   He has intermittent right shoulder pain.  He had a hairline fracture and has been on light duty for 9 weeks.  The shoulder pain is much better.  He goes back to orthopedics soon to see if he can return to normal duty.  He is currently not taking any pain medication.  Depression, increased stress: He admits to increased depression and increased stress.  Some of the stresses related to finances.  Some of his depression is related to his work situation, which he is not able to leave at this time.  He is drinking more alcohol than he should.  He has been on medication in the past, but had side effects.  He does not think he wants to take medication.   Medications and allergies reviewed with patient and updated if appropriate.  Patient Active Problem List   Diagnosis Date Noted  . Right wrist pain 04/25/2017  . Rash and nonspecific skin eruption 04/25/2017  . Encounter for pain management 01/19/2017  . Hyperlipidemia 01/19/2017  . Cluster headaches 01/04/2017  . Instability of right shoulder joint 05/27/2016  . Nonallopathic lesion of thoracic region 05/27/2016  . Nonallopathic lesion of cervical region 05/27/2016  . Nonallopathic lesion of rib cage 05/27/2016  . Chronic right shoulder pain 01/21/2016  . GERD (gastroesophageal reflux disease) 01/21/2016  . Headache 01/21/2016    Current Outpatient Medications on File Prior to Visit  Medication Sig Dispense Refill  . lidocaine (XYLOCAINE) 2 % solution Use as directed 15 mLs in the mouth or throat as needed for mouth pain. 100 mL 2  . triamcinolone ointment (KENALOG) 0.5 % Apply 1 application topically 2 (two) times daily. 30 g 3  . HYDROcodone-acetaminophen (NORCO/VICODIN) 5-325 MG tablet 1 po q d prn pain  Ok to fill on 12/01/17 (Patient not taking: Reported on 01/24/2018) 45 tablet 0   No current  facility-administered medications on file prior to visit.     Past Medical History:  Diagnosis Date  . GERD (gastroesophageal reflux disease)     Past Surgical History:  Procedure Laterality Date  . SHOULDER SURGERY      Social History   Socioeconomic History  . Marital status: Married    Spouse name: [name]  . Number of children: Not on file  . Years of education: Not on file  . Highest education level: Not on file  Occupational History  . Not on file  Social Needs  . Financial resource strain: Not on file  . Food insecurity:    Worry: Not on file    Inability: Not on file  . Transportation needs:    Medical: Not on file    Non-medical: Not on file  Tobacco Use  . Smoking status: Former Games developermoker  . Smokeless tobacco: Current User  Substance and Sexual Activity  . Alcohol use: Yes    Comment: occasional  . Drug use: No  . Sexual activity: Not on file  Lifestyle  . Physical activity:    Days per week: Not on file    Minutes per session: Not on file  . Stress: Not on file  Relationships  . Social connections:    Talks on phone: Not on file    Gets together: Not on file    Attends religious service: Not on file    Active  member of club or organization: Not on file    Attends meetings of clubs or organizations: Not on file    Relationship status: Not on file  Other Topics Concern  . Not on file  Social History Narrative   Curator    Family History  Problem Relation Age of Onset  . Hypertension Maternal Grandfather   . Heart disease Maternal Grandfather   . Cancer Paternal Grandmother        pancreatic cancer  . Stroke Paternal Grandfather   . Diverticulitis Father     Review of Systems  Constitutional: Negative for chills and fever.  Respiratory: Negative for cough, shortness of breath and wheezing.   Cardiovascular: Negative for chest pain, palpitations and leg swelling.  Gastrointestinal: Negative for abdominal pain, blood in stool, constipation,  diarrhea and nausea.       Gerd daily - takes otc medications  Genitourinary: Negative for dysuria and hematuria.  Musculoskeletal: Positive for arthralgias (R shoulder). Negative for back pain.  Skin: Negative for color change and rash.  Neurological: Negative for dizziness, light-headedness and headaches.  Psychiatric/Behavioral: Positive for dysphoric mood. The patient is nervous/anxious.        Objective:   Vitals:   01/24/18 1459  BP: 118/80  Pulse: 68  Resp: 16  Temp: 98.3 F (36.8 C)  SpO2: 96%   Filed Weights   01/24/18 1459  Weight: 222 lb 12.8 oz (101.1 kg)   Body mass index is 30.22 kg/m.  Wt Readings from Last 3 Encounters:  01/24/18 222 lb 12.8 oz (101.1 kg)  10/19/17 220 lb (99.8 kg)  09/13/17 217 lb (98.4 kg)     Physical Exam Constitutional: He appears well-developed and well-nourished. No distress.  HENT:  Head: Normocephalic and atraumatic.  Right Ear: External ear normal.  Left Ear: External ear normal.  Mouth/Throat: Oropharynx is clear and moist.  Normal ear canals and TM b/l  Eyes: Conjunctivae and EOM are normal.  Neck: Neck supple. No tracheal deviation present. No thyromegaly present.  No carotid bruit  Cardiovascular: Normal rate, regular rhythm, normal heart sounds and intact distal pulses.  No murmur heard. Pulmonary/Chest: Effort normal and breath sounds normal. No respiratory distress. He has no wheezes. He has no rales.  Abdominal: Soft. He exhibits no distension. There is no tenderness.  Genitourinary: deferred  Musculoskeletal: He exhibits no edema.  Lymphadenopathy:   He has no cervical adenopathy.  Skin: Skin is warm and dry. He is not diaphoretic.  Psychiatric: He has a normal mood and affect. His behavior is normal.         Assessment & Plan:   Physical exam: Screening blood work ordered Immunizations   Up to date  Exercise  Active at work - no exercise Weight   Advised weight loss Skin   no concerns Substance  abuse  Drinking too much alcohol - advised decreased, dipping tobacco  See Problem List for Assessment and Plan of chronic medical problems.

## 2018-01-24 ENCOUNTER — Encounter: Payer: Self-pay | Admitting: Internal Medicine

## 2018-01-24 ENCOUNTER — Other Ambulatory Visit (INDEPENDENT_AMBULATORY_CARE_PROVIDER_SITE_OTHER): Payer: Managed Care, Other (non HMO)

## 2018-01-24 ENCOUNTER — Ambulatory Visit (INDEPENDENT_AMBULATORY_CARE_PROVIDER_SITE_OTHER): Payer: Managed Care, Other (non HMO) | Admitting: Internal Medicine

## 2018-01-24 VITALS — BP 118/80 | HR 68 | Temp 98.3°F | Resp 16 | Ht 72.0 in | Wt 222.8 lb

## 2018-01-24 DIAGNOSIS — E7849 Other hyperlipidemia: Secondary | ICD-10-CM

## 2018-01-24 DIAGNOSIS — Z0000 Encounter for general adult medical examination without abnormal findings: Secondary | ICD-10-CM

## 2018-01-24 DIAGNOSIS — R51 Headache: Secondary | ICD-10-CM

## 2018-01-24 DIAGNOSIS — F419 Anxiety disorder, unspecified: Secondary | ICD-10-CM | POA: Insufficient documentation

## 2018-01-24 DIAGNOSIS — F329 Major depressive disorder, single episode, unspecified: Secondary | ICD-10-CM | POA: Insufficient documentation

## 2018-01-24 DIAGNOSIS — K219 Gastro-esophageal reflux disease without esophagitis: Secondary | ICD-10-CM | POA: Diagnosis not present

## 2018-01-24 DIAGNOSIS — F32A Depression, unspecified: Secondary | ICD-10-CM

## 2018-01-24 DIAGNOSIS — G8929 Other chronic pain: Secondary | ICD-10-CM | POA: Insufficient documentation

## 2018-01-24 LAB — COMPREHENSIVE METABOLIC PANEL
ALT: 28 U/L (ref 0–53)
AST: 17 U/L (ref 0–37)
Albumin: 4.4 g/dL (ref 3.5–5.2)
Alkaline Phosphatase: 36 U/L — ABNORMAL LOW (ref 39–117)
BILIRUBIN TOTAL: 0.8 mg/dL (ref 0.2–1.2)
BUN: 16 mg/dL (ref 6–23)
CALCIUM: 9.3 mg/dL (ref 8.4–10.5)
CHLORIDE: 102 meq/L (ref 96–112)
CO2: 26 meq/L (ref 19–32)
CREATININE: 1.06 mg/dL (ref 0.40–1.50)
GFR: 84.23 mL/min (ref >60.00)
Glucose, Bld: 92 mg/dL (ref 70–99)
Potassium: 3.8 mEq/L (ref 3.5–5.1)
SODIUM: 137 meq/L (ref 135–145)
Total Protein: 7.1 g/dL (ref 6.0–8.3)

## 2018-01-24 LAB — CBC WITH DIFFERENTIAL/PLATELET
BASOS ABS: 0 10*3/uL (ref 0.0–0.1)
BASOS PCT: 0.6 % (ref 0.0–3.0)
EOS ABS: 0.1 10*3/uL (ref 0.0–0.7)
Eosinophils Relative: 1.7 % (ref 0.0–5.0)
HCT: 41.1 % (ref 39.0–52.0)
HEMOGLOBIN: 14.2 g/dL (ref 13.0–17.0)
Lymphocytes Relative: 32.8 % (ref 12.0–46.0)
Lymphs Abs: 2.2 10*3/uL (ref 0.7–4.0)
MCHC: 34.7 g/dL (ref 30.0–36.0)
MCV: 88.3 fl (ref 78.0–100.0)
MONOS PCT: 9.2 % (ref 3.0–12.0)
Monocytes Absolute: 0.6 10*3/uL (ref 0.1–1.0)
Neutro Abs: 3.7 10*3/uL (ref 1.4–7.7)
Neutrophils Relative %: 55.7 % (ref 43.0–77.0)
Platelets: 305 10*3/uL (ref 150.0–400.0)
RBC: 4.65 Mil/uL (ref 4.22–5.81)
RDW: 12.9 % (ref 11.5–15.5)
WBC: 6.6 10*3/uL (ref 4.0–10.5)

## 2018-01-24 LAB — LIPID PANEL
CHOL/HDL RATIO: 7
CHOLESTEROL: 252 mg/dL — AB (ref 0–200)
HDL: 37 mg/dL — ABNORMAL LOW (ref >39.00)
NonHDL: 215.43
Triglycerides: 241 mg/dL — ABNORMAL HIGH (ref 0.0–149.0)
VLDL: 48.2 mg/dL — ABNORMAL HIGH (ref 0.0–40.0)

## 2018-01-24 LAB — TSH: TSH: 1.21 u[IU]/mL (ref 0.35–4.50)

## 2018-01-24 LAB — LDL CHOLESTEROL, DIRECT: Direct LDL: 166 mg/dL

## 2018-01-24 MED ORDER — VENLAFAXINE HCL ER 37.5 MG PO CP24: 37.5000 mg | ORAL_CAPSULE | Freq: Every day | ORAL | 5 refills | Status: DC

## 2018-01-24 MED FILL — VENLAFAXINE HCL ER 37.5 MG: 37.5 | 30 days supply | Qty: 30 | Fill #0

## 2018-01-24 NOTE — Assessment & Plan Note (Signed)
Check lipid panel, cmp, tsh Regular exercise and healthy diet encouraged   

## 2018-01-24 NOTE — Patient Instructions (Addendum)
Tests ordered today. Your results will be released to MyChart (or called to you) after review, usually within 72hours after test completion. If any changes need to be made, you will be notified at that same time.   Medications reviewed and updated.  Changes include :   Start effexor 37.5 mg daily.  Try melatonin for your sleep.    Your prescription(s) have been submitted to your pharmacy. Please take as directed and contact our office if you believe you are having problem(s) with the medication(s).    Health Maintenance, Male A healthy lifestyle and preventive care is important for your health and wellness. Ask your health care provider about what schedule of regular examinations is right for you. What should I know about weight and diet? Eat a Healthy Diet  Eat plenty of vegetables, fruits, whole grains, low-fat dairy products, and lean protein.  Do not eat a lot of foods high in solid fats, added sugars, or salt.  Maintain a Healthy Weight Regular exercise can help you achieve or maintain a healthy weight. You should:  Do at least 150 minutes of exercise each week. The exercise should increase your heart rate and make you sweat (moderate-intensity exercise).  Do strength-training exercises at least twice a week.  Watch Your Levels of Cholesterol and Blood Lipids  Have your blood tested for lipids and cholesterol every 5 years starting at 35 years of age. If you are at high risk for heart disease, you should start having your blood tested when you are 35 years old. You may need to have your cholesterol levels checked more often if: ? Your lipid or cholesterol levels are high. ? You are older than 35 years of age. ? You are at high risk for heart disease.  What should I know about cancer screening? Many types of cancers can be detected early and may often be prevented. Lung Cancer  You should be screened every year for lung cancer if: ? You are a current smoker who has smoked  for at least 30 years. ? You are a former smoker who has quit within the past 15 years.  Talk to your health care provider about your screening options, when you should start screening, and how often you should be screened.  Colorectal Cancer  Routine colorectal cancer screening usually begins at 35 years of age and should be repeated every 5-10 years until you are 35 years old. You may need to be screened more often if early forms of precancerous polyps or small growths are found. Your health care provider may recommend screening at an earlier age if you have risk factors for colon cancer.  Your health care provider may recommend using home test kits to check for hidden blood in the stool.  A small camera at the end of a tube can be used to examine your colon (sigmoidoscopy or colonoscopy). This checks for the earliest forms of colorectal cancer.  Prostate and Testicular Cancer  Depending on your age and overall health, your health care provider may do certain tests to screen for prostate and testicular cancer.  Talk to your health care provider about any symptoms or concerns you have about testicular or prostate cancer.  Skin Cancer  Check your skin from head to toe regularly.  Tell your health care provider about any new moles or changes in moles, especially if: ? There is a change in a mole's size, shape, or color. ? You have a mole that is larger than a pencil  eraser.  Always use sunscreen. Apply sunscreen liberally and repeat throughout the day.  Protect yourself by wearing long sleeves, pants, a wide-brimmed hat, and sunglasses when outside.  What should I know about heart disease, diabetes, and high blood pressure?  If you are 31-78 years of age, have your blood pressure checked every 3-5 years. If you are 56 years of age or older, have your blood pressure checked every year. You should have your blood pressure measured twice-once when you are at a hospital or clinic, and  once when you are not at a hospital or clinic. Record the average of the two measurements. To check your blood pressure when you are not at a hospital or clinic, you can use: ? An automated blood pressure machine at a pharmacy. ? A home blood pressure monitor.  Talk to your health care provider about your target blood pressure.  If you are between 57-97 years old, ask your health care provider if you should take aspirin to prevent heart disease.  Have regular diabetes screenings by checking your fasting blood sugar level. ? If you are at a normal weight and have a low risk for diabetes, have this test once every three years after the age of 49. ? If you are overweight and have a high risk for diabetes, consider being tested at a younger age or more often.  A one-time screening for abdominal aortic aneurysm (AAA) by ultrasound is recommended for men aged 65-75 years who are current or former smokers. What should I know about preventing infection? Hepatitis B If you have a higher risk for hepatitis B, you should be screened for this virus. Talk with your health care provider to find out if you are at risk for hepatitis B infection. Hepatitis C Blood testing is recommended for:  Everyone born from [birth year] through 1963.  Anyone with known risk factors for hepatitis C.  Sexually Transmitted Diseases (STDs)  You should be screened each year for STDs including gonorrhea and chlamydia if: ? You are sexually active and are younger than 35 years of age. ? You are older than 35 years of age and your health care provider tells you that you are at risk for this type of infection. ? Your sexual activity has changed since you were last screened and you are at an increased risk for chlamydia or gonorrhea. Ask your health care provider if you are at risk.  Talk with your health care provider about whether you are at high risk of being infected with HIV. Your health care provider may recommend a  prescription medicine to help prevent HIV infection.  What else can I do?  Schedule regular health, dental, and eye exams.  Stay current with your vaccines (immunizations).  Do not use any tobacco products, such as cigarettes, chewing tobacco, and e-cigarettes. If you need help quitting, ask your health care provider.  Limit alcohol intake to no more than 2 drinks per day. One drink equals 12 ounces of beer, 5 ounces of wine, or 1 ounces of hard liquor.  Do not use street drugs.  Do not share needles.  Ask your health care provider for help if you need support or information about quitting drugs.  Tell your health care provider if you often feel depressed.  Tell your health care provider if you have ever been abused or do not feel safe at home. This information is not intended to replace advice given to you by your health care provider. Make sure you  discuss any questions you have with your health care provider. Document Released: 08/07/2007 Document Revised: 10/08/2015 Document Reviewed: 11/12/2014 Elsevier Interactive Patient Education  [employer]2018 Elsevier Inc.

## 2018-01-24 NOTE — Assessment & Plan Note (Signed)
Related to his job and finances He is reluctant try medication but discussed the benefits outweigh risk of side effects - he can stop it if he has side effects Start effexor 37.5 mg daily

## 2018-01-24 NOTE — Assessment & Plan Note (Signed)
Controlled with Allegra D as needed

## 2018-01-24 NOTE — Assessment & Plan Note (Signed)
Taking otc medications controlled

## 2018-01-31 ENCOUNTER — Telehealth: Payer: Self-pay

## 2018-01-31 MED ORDER — FAMOTIDINE 20 MG PO TABS: 20.0000 mg | ORAL_TABLET | Freq: Two times a day (BID) | ORAL | 5 refills | Status: DC

## 2018-01-31 MED FILL — FAMOTIDINE 20 MG TABLET: 20 | 30 days supply | Qty: 60 | Fill #0

## 2018-01-31 NOTE — Telephone Encounter (Signed)
Pt needs alternative for zantac sent into [facility].

## 2018-02-20 ENCOUNTER — Other Ambulatory Visit: Payer: Self-pay

## 2018-02-20 MED ORDER — VENLAFAXINE HCL ER 75 MG PO CP24: 75.0000 mg | ORAL_CAPSULE | Freq: Every day | ORAL | 0 refills | Status: DC

## 2018-02-20 MED FILL — VENLAFAXINE HCL ER 75 MG CA: 75 | 90 days supply | Qty: 90 | Fill #0

## 2018-02-24 MED FILL — FAMOTIDINE 20 MG TABLET: 20 | 30 days supply | Qty: 60 | Fill #1

## 2018-03-01 ENCOUNTER — Ambulatory Visit (INDEPENDENT_AMBULATORY_CARE_PROVIDER_SITE_OTHER): Payer: 59 | Admitting: Orthopedic Surgery

## 2018-03-01 ENCOUNTER — Encounter (INDEPENDENT_AMBULATORY_CARE_PROVIDER_SITE_OTHER): Payer: Self-pay | Admitting: Orthopedic Surgery

## 2018-03-01 ENCOUNTER — Encounter (INDEPENDENT_AMBULATORY_CARE_PROVIDER_SITE_OTHER): Payer: Self-pay

## 2018-03-01 DIAGNOSIS — M25511 Pain in right shoulder: Secondary | ICD-10-CM

## 2018-03-01 DIAGNOSIS — G8929 Other chronic pain: Secondary | ICD-10-CM | POA: Diagnosis not present

## 2018-03-01 NOTE — Progress Notes (Signed)
Office Visit Note   Patient: Jamie Powell           Date of Birth: 06-Nov-1980           MRN: [id number] Visit Date: 03/01/2018 Requested by: Pincus Sanes, MD [address] [place], [place] [address] PCP: Pincus Sanes, MD  Subjective: Chief Complaint  Patient presents with  . Right Shoulder - Follow-up    HPI: [name] is a patient with right shoulder pain.  In general it feels a little bit better than before.  Had chiropractic adjustments x7 on his neck which helped him.  He has not had any pain in a while.  He would like to return to regular duty work.  He has had MRI scan and extensive work-up on the right shoulder which shows no operative problem there.              ROS: All systems reviewed are negative as they relate to the chief complaint within the history of present illness.  Patient denies  fevers or chills.   Assessment & Plan: Visit Diagnoses:  1. Chronic right shoulder pain     Plan: Impression is chronic right shoulder pain improved by chiropractic adjustments.  I think at this time [name] is desiring to return to regular duty work.  Nothing really operative in the shoulder.  If the shoulder does not hold up on a regular duty work then he may need to consider a different type of less strenuous and physical job.  I will see him back as needed  Follow-Up Instructions: Return if symptoms worsen or fail to improve.   Orders:  No orders of the defined types were placed in this encounter.  No orders of the defined types were placed in this encounter.     Procedures: No procedures performed   Clinical Data: No additional findings.  Objective: Vital Signs: There were no vitals taken for this visit.  Physical Exam:   Constitutional: Patient appears well-developed HEENT:  Head: Normocephalic Eyes:EOM are normal Neck: Normal range of motion Cardiovascular: Normal rate Pulmonary/chest: Effort normal Neurologic: Patient is alert Skin: Skin is  warm Psychiatric: Patient has normal mood and affect    Ortho Exam: Ortho exam demonstrates full active and passive range of motion of the shoulder.  Neck range of motion is full.  Strength is good in the right arm and shoulder region with no coarse grinding or crepitus with active or passive range of motion.  Specialty Comments:  No specialty comments available.  Imaging: No results found.   PMFS History: Patient Active Problem List   Diagnosis Date Noted  . Chronic headaches 01/24/2018  . Anxiety and depression 01/24/2018  . Right wrist pain 04/25/2017  . Rash and nonspecific skin eruption 04/25/2017  . Encounter for pain management 01/19/2017  . Hyperlipidemia 01/19/2017  . Instability of right shoulder joint 05/27/2016  . Nonallopathic lesion of thoracic region 05/27/2016  . Nonallopathic lesion of cervical region 05/27/2016  . Nonallopathic lesion of rib cage 05/27/2016  . Chronic right shoulder pain 01/21/2016  . GERD (gastroesophageal reflux disease) 01/21/2016  . Headache 01/21/2016   Past Medical History:  Diagnosis Date  . GERD (gastroesophageal reflux disease)     Family History  Problem Relation Age of Onset  . Hypertension Maternal Grandfather   . Heart disease Maternal Grandfather   . Cancer Paternal Grandmother        pancreatic cancer  . Stroke Paternal Grandfather   . Diverticulitis Father  Past Surgical History:  Procedure Laterality Date  . SHOULDER SURGERY     Social History   Occupational History  . Not on file  Tobacco Use  . Smoking status: Former Games developermoker  . Smokeless tobacco: Current User  Substance and Sexual Activity  . Alcohol use: Yes    Comment: occasional  . Drug use: No  . Sexual activity: Not on file

## 2018-04-17 MED FILL — FAMOTIDINE 20 MG TABLET: 20 | 30 days supply | Qty: 60 | Fill #2

## 2018-04-25 NOTE — Progress Notes (Signed)
Subjective:    Patient ID: Jamie Powell, male    DOB: 04/01/1980, 38 y.o.   MRN: [id number]  HPI The patient is here for follow up.  Depresion, Anxiety: He is taking his medication daily as prescribed. He denies any side effects from the medication. He initially said his mood was good, but after talking more there is still anxiety and depression that are not ideally controlled.  He is unsure if he needs more medication.  He has difficulty falling asleep but that is not new.  He has been drinking too much alcohol recently, but thinks he understands why - there are some things he is not happy about and that is how he is dealing with it.  He know he needs to cut back and fix what is bothering him.    Medications and allergies reviewed with patient and updated if appropriate.  Patient Active Problem List   Diagnosis Date Noted  . Chronic headaches 01/24/2018  . Anxiety and depression 01/24/2018  . Right wrist pain 04/25/2017  . Rash and nonspecific skin eruption 04/25/2017  . Encounter for pain management 01/19/2017  . Hyperlipidemia 01/19/2017  . Instability of right shoulder joint 05/27/2016  . Nonallopathic lesion of thoracic region 05/27/2016  . Nonallopathic lesion of cervical region 05/27/2016  . Nonallopathic lesion of rib cage 05/27/2016  . Chronic right shoulder pain 01/21/2016  . GERD (gastroesophageal reflux disease) 01/21/2016  . Headache 01/21/2016    Current Outpatient Medications on File Prior to Visit  Medication Sig Dispense Refill  . famotidine (PEPCID) 20 MG tablet Take 1 tablet (20 mg total) by mouth 2 (two) times daily. 60 tablet 5  . lidocaine (XYLOCAINE) 2 % solution Use as directed 15 mLs in the mouth or throat as needed for mouth pain. 100 mL 2  . triamcinolone ointment (KENALOG) 0.5 % Apply 1 application topically 2 (two) times daily. 30 g 3  . venlafaxine XR (EFFEXOR XR) 75 MG 24 hr capsule Take 1 capsule (75 mg total) by mouth daily with breakfast. 90  capsule 0  . HYDROcodone-acetaminophen (NORCO/VICODIN) 5-325 MG tablet 1 po q d prn pain  Ok to fill on 12/01/17 (Patient not taking: Reported on 04/26/2018) 45 tablet 0   No current facility-administered medications on file prior to visit.     Past Medical History:  Diagnosis Date  . GERD (gastroesophageal reflux disease)     Past Surgical History:  Procedure Laterality Date  . SHOULDER SURGERY      Social History   Socioeconomic History  . Marital status: Married    Spouse name: [name]  . Number of children: Not on file  . Years of education: Not on file  . Highest education level: Not on file  Occupational History  . Not on file  Social Needs  . Financial resource strain: Not on file  . Food insecurity:    Worry: Not on file    Inability: Not on file  . Transportation needs:    Medical: Not on file    Non-medical: Not on file  Tobacco Use  . Smoking status: Former Games developer  . Smokeless tobacco: Current User  Substance and Sexual Activity  . Alcohol use: Yes    Comment: occasional  . Drug use: No  . Sexual activity: Not on file  Lifestyle  . Physical activity:    Days per week: Not on file    Minutes per session: Not on file  . Stress: Not on file  Relationships  .  Social connections:    Talks on phone: Not on file    Gets together: Not on file    Attends religious service: Not on file    Active member of club or organization: Not on file    Attends meetings of clubs or organizations: Not on file    Relationship status: Not on file  Other Topics Concern  . Not on file  Social History Narrative   Curator    Family History  Problem Relation Age of Onset  . Hypertension Maternal Grandfather   . Heart disease Maternal Grandfather   . Cancer Paternal Grandmother        pancreatic cancer  . Stroke Paternal Grandfather   . Diverticulitis Father     Review of Systems  Constitutional: Positive for fatigue. Negative for appetite change.    Psychiatric/Behavioral: Positive for dysphoric mood and sleep disturbance. The patient is nervous/anxious.        Objective:   Vitals:   04/26/18 1605  BP: 118/80  Pulse: 74  Resp: 16  Temp: 98.4 F (36.9 C)  SpO2: 97%   BP Readings from Last 3 Encounters:  04/26/18 118/80  01/24/18 118/80  09/13/17 104/70   Wt Readings from Last 3 Encounters:  04/26/18 221 lb 12.8 oz (100.6 kg)  01/24/18 222 lb 12.8 oz (101.1 kg)  10/19/17 220 lb (99.8 kg)   Body mass index is 30.08 kg/m.   Physical Exam Constitutional:      General: He is not in acute distress.    Appearance: Normal appearance. He is not ill-appearing.  Neurological:     Mental Status: He is alert.  Psychiatric:        Behavior: Behavior normal.        Thought Content: Thought content normal.        Judgment: Judgment normal.     Comments: Depressed mood and affect          Assessment & Plan:    See Problem List for Assessment and Plan of chronic medical problems.

## 2018-04-26 ENCOUNTER — Encounter: Payer: Self-pay | Admitting: Internal Medicine

## 2018-04-26 ENCOUNTER — Ambulatory Visit (INDEPENDENT_AMBULATORY_CARE_PROVIDER_SITE_OTHER): Payer: 59 | Admitting: Internal Medicine

## 2018-04-26 VITALS — BP 118/80 | HR 74 | Temp 98.4°F | Resp 16 | Ht 72.0 in | Wt 221.8 lb

## 2018-04-26 DIAGNOSIS — F419 Anxiety disorder, unspecified: Secondary | ICD-10-CM | POA: Diagnosis not present

## 2018-04-26 DIAGNOSIS — F329 Major depressive disorder, single episode, unspecified: Secondary | ICD-10-CM

## 2018-04-26 DIAGNOSIS — F32A Depression, unspecified: Secondary | ICD-10-CM

## 2018-04-26 MED ORDER — VENLAFAXINE HCL ER 150 MG PO CP24: 150.0000 mg | ORAL_CAPSULE | Freq: Every day | ORAL | 5 refills | Status: DC

## 2018-04-26 NOTE — Assessment & Plan Note (Signed)
depression and anxiety not ideally controlled Will try increasing effexor to 150 mg daily Work on decreasing alcohol intake

## 2018-04-26 NOTE — Patient Instructions (Signed)
Increase the effexor to 150 mg daily -- take two pills daily in the morning.    Let me know how this works.

## 2018-04-27 MED FILL — VENLAFAXINE HCL ER 150 MG C: 150 | 30 days supply | Qty: 30 | Fill #0

## 2018-05-08 MED FILL — FAMOTIDINE 20 MG TABLET: 20 | 30 days supply | Qty: 60 | Fill #3

## 2018-05-09 ENCOUNTER — Other Ambulatory Visit: Payer: Self-pay | Admitting: Internal Medicine

## 2018-05-09 MED ORDER — FAMOTIDINE 40 MG PO TABS: 40.0000 mg | ORAL_TABLET | Freq: Two times a day (BID) | ORAL | 3 refills | Status: DC

## 2018-05-20 MED FILL — FAMOTIDINE 20 MG TABLET: 20 | 30 days supply | Qty: 120 | Fill #0

## 2018-05-30 MED FILL — VENLAFAXINE HCL ER 150 MG C: 150 | 30 days supply | Qty: 30 | Fill #1

## 2018-05-30 MED FILL — TRIAMCINOLONE 0.5% OINTMENT: 0.5 | 15 days supply | Qty: 30 | Fill #2

## 2018-07-03 MED FILL — VENLAFAXINE HCL ER 150 MG C: 150 | 30 days supply | Qty: 30 | Fill #2

## 2018-07-27 MED FILL — VENLAFAXINE HCL ER 150 MG C: 150 | 30 days supply | Qty: 30 | Fill #3

## 2018-07-27 MED FILL — SM ACID REDUCER 20 MG TAB: 20 | 25 days supply | Qty: 100 | Fill #1

## 2018-09-18 MED FILL — FAMOTIDINE 20 MG TABLET: 20 | 30 days supply | Qty: 60 | Fill #4

## 2018-09-19 ENCOUNTER — Other Ambulatory Visit: Payer: 59

## 2018-09-20 ENCOUNTER — Encounter: Payer: Self-pay | Admitting: Internal Medicine

## 2018-09-20 ENCOUNTER — Other Ambulatory Visit (INDEPENDENT_AMBULATORY_CARE_PROVIDER_SITE_OTHER): Payer: 59

## 2018-09-20 ENCOUNTER — Ambulatory Visit
Admission: RE | Admit: 2018-09-20 | Discharge: 2018-09-20 | Disposition: A | Discharge status: Home / Self Care | Payer: 59 | Source: Ambulatory Visit | Attending: Internal Medicine | Admitting: Internal Medicine

## 2018-09-20 ENCOUNTER — Ambulatory Visit (INDEPENDENT_AMBULATORY_CARE_PROVIDER_SITE_OTHER): Payer: 59 | Admitting: Internal Medicine

## 2018-09-20 ENCOUNTER — Telehealth: Payer: Self-pay | Admitting: Internal Medicine

## 2018-09-20 ENCOUNTER — Other Ambulatory Visit: Payer: Self-pay

## 2018-09-20 VITALS — BP 128/70 | HR 85 | Temp 98.5°F | Resp 16 | Ht 72.0 in | Wt 226.0 lb

## 2018-09-20 DIAGNOSIS — R110 Nausea: Secondary | ICD-10-CM | POA: Diagnosis not present

## 2018-09-20 DIAGNOSIS — R1031 Right lower quadrant pain: Secondary | ICD-10-CM

## 2018-09-20 LAB — POCT URINALYSIS DIPSTICK
Bilirubin, UA: NEGATIVE
Blood, UA: NEGATIVE
Glucose, UA: NEGATIVE
Ketones, UA: NEGATIVE
Leukocytes, UA: NEGATIVE
Nitrite, UA: NEGATIVE
Protein, UA: NEGATIVE
Spec Grav, UA: 1.015 (ref 1.010–1.025)
Urobilinogen, UA: NEGATIVE E.U./dL — AB
pH, UA: 6 (ref 5.0–8.0)

## 2018-09-20 LAB — CBC WITH DIFFERENTIAL/PLATELET
Basophils Absolute: 0 K/uL (ref 0.0–0.1)
Basophils Relative: 0.5 % (ref 0.0–3.0)
Eosinophils Absolute: 0.1 K/uL (ref 0.0–0.7)
Eosinophils Relative: 1.6 % (ref 0.0–5.0)
HCT: 41.4 % (ref 39.0–52.0)
Hemoglobin: 14.1 g/dL (ref 13.0–17.0)
Lymphocytes Relative: 32.5 % (ref 12.0–46.0)
Lymphs Abs: 1.9 K/uL (ref 0.7–4.0)
MCHC: 34.2 g/dL (ref 30.0–36.0)
MCV: 89.8 fl (ref 78.0–100.0)
Monocytes Absolute: 0.5 K/uL (ref 0.1–1.0)
Monocytes Relative: 9.4 % (ref 3.0–12.0)
Neutro Abs: 3.2 K/uL (ref 1.4–7.7)
Neutrophils Relative %: 56 % (ref 43.0–77.0)
Platelets: 252 K/uL (ref 150.0–400.0)
RBC: 4.61 Mil/uL (ref 4.22–5.81)
RDW: 13.2 % (ref 11.5–15.5)
WBC: 5.8 K/uL (ref 4.0–10.5)

## 2018-09-20 LAB — COMPREHENSIVE METABOLIC PANEL WITH GFR
ALT: 44 U/L (ref 0–53)
AST: 21 U/L (ref 0–37)
Albumin: 4.4 g/dL (ref 3.5–5.2)
Alkaline Phosphatase: 39 U/L (ref 39–117)
BUN: 10 mg/dL (ref 6–23)
CO2: 27 meq/L (ref 19–32)
Calcium: 9.2 mg/dL (ref 8.4–10.5)
Chloride: 104 meq/L (ref 96–112)
Creatinine, Ser: 1.06 mg/dL (ref 0.40–1.50)
GFR: 78.96 mL/min
Glucose, Bld: 95 mg/dL (ref 70–99)
Potassium: 3.9 meq/L (ref 3.5–5.1)
Sodium: 139 meq/L (ref 135–145)
Total Bilirubin: 0.5 mg/dL (ref 0.2–1.2)
Total Protein: 7 g/dL (ref 6.0–8.3)

## 2018-09-20 MED ORDER — IOPAMIDOL (ISOVUE-300) INJECTION 61%
125.0000 mL | Freq: Once | INTRAVENOUS | Status: AC | PRN
  Administered 2018-09-20: 125 mL via INTRAVENOUS

## 2018-09-20 NOTE — Patient Instructions (Signed)
Have blood work done and then go for the CT scan.     We will call you with the results.

## 2018-09-20 NOTE — Assessment & Plan Note (Signed)
Exam and history concerning for appendicitis Urine dip normal - no blood Cbc, cmp stat Ct Abd/pelvis stat Treatment vs ED depending on results

## 2018-09-20 NOTE — Telephone Encounter (Signed)
Courtney with [facility] called to report STAT CT abd. And pelvis is in Epic. Lovena Le in the practice notified.

## 2018-09-20 NOTE — Progress Notes (Signed)
Subjective:    Patient ID: Jamie Powell, male    DOB: 04-20-1980, 38 y.o.   MRN: [id number]  HPI The patient is here for an acute visit.   Abdominal pain:  It started three days ago.  It was intermittent initially and has been getting worse - it is more frequent and more painful.  The pain is a discomfort and feels like bloating across his lower abdomen.  The pain is more focused now in the RLQ.  The pain is intermittent and can reach 6/10.  He has had some nausea.  The pain was more with driving over here with the bumps.  He still has an appetite.  He denies changes in bowels and urinary symptoms. He denies fever.     Medications and allergies reviewed with patient and updated if appropriate.  Patient Active Problem List   Diagnosis Date Noted  . Chronic headaches 01/24/2018  . Anxiety and depression 01/24/2018  . Right wrist pain 04/25/2017  . Rash and nonspecific skin eruption 04/25/2017  . Encounter for pain management 01/19/2017  . Hyperlipidemia 01/19/2017  . Instability of right shoulder joint 05/27/2016  . Nonallopathic lesion of thoracic region 05/27/2016  . Nonallopathic lesion of cervical region 05/27/2016  . Nonallopathic lesion of rib cage 05/27/2016  . Chronic right shoulder pain 01/21/2016  . GERD (gastroesophageal reflux disease) 01/21/2016  . Headache 01/21/2016    Current Outpatient Medications on File Prior to Visit  Medication Sig Dispense Refill  . famotidine (PEPCID) 40 MG tablet Take 1 tablet (40 mg total) by mouth 2 (two) times daily. 180 tablet 3  . triamcinolone ointment (KENALOG) 0.5 % Apply 1 application topically 2 (two) times daily. 30 g 3   No current facility-administered medications on file prior to visit.     Past Medical History:  Diagnosis Date  . GERD (gastroesophageal reflux disease)     Past Surgical History:  Procedure Laterality Date  . SHOULDER SURGERY      Social History   Socioeconomic History  . Marital status:  Married    Spouse name: Roselyn Reef  . Number of children: Not on file  . Years of education: Not on file  . Highest education level: Not on file  Occupational History  . Not on file  Social Needs  . Financial resource strain: Not on file  . Food insecurity    Worry: Not on file    Inability: Not on file  . Transportation needs    Medical: Not on file    Non-medical: Not on file  Tobacco Use  . Smoking status: Former Research scientist (life sciences)  . Smokeless tobacco: Current User  Substance and Sexual Activity  . Alcohol use: Yes    Comment: occasional  . Drug use: No  . Sexual activity: Not on file  Lifestyle  . Physical activity    Days per week: Not on file    Minutes per session: Not on file  . Stress: Not on file  Relationships  . Social Herbalist on phone: Not on file    Gets together: Not on file    Attends religious service: Not on file    Active member of club or organization: Not on file    Attends meetings of clubs or organizations: Not on file    Relationship status: Not on file  Other Topics Concern  . Not on file  Social History Narrative   Dealer    Family History  Problem Relation Age  of Onset  . Hypertension Maternal Grandfather   . Heart disease Maternal Grandfather   . Cancer Paternal Grandmother        pancreatic cancer  . Stroke Paternal Grandfather   . Diverticulitis Father     Review of Systems  Constitutional: Negative for appetite change and fever.  Gastrointestinal: Positive for abdominal distention (bloating), abdominal pain and nausea (mild). Negative for blood in stool (no black stool), constipation, diarrhea and vomiting.  Genitourinary: Negative for dysuria, frequency and hematuria.  Musculoskeletal: Negative for back pain.  Neurological: Negative for light-headedness and headaches.       Objective:   Vitals:   09/20/18 1027  BP: 128/70  Pulse: 85  Resp: 16  Temp: 98.5 F (36.9 C)  SpO2: 98%   BP Readings from Last 3 Encounters:   09/20/18 128/70  04/26/18 118/80  01/24/18 118/80   Wt Readings from Last 3 Encounters:  09/20/18 226 lb (102.5 kg)  04/26/18 221 lb 12.8 oz (100.6 kg)  01/24/18 222 lb 12.8 oz (101.1 kg)   Body mass index is 30.65 kg/m.   Physical Exam Constitutional:      General: He is not in acute distress.    Appearance: He is well-developed. He is not ill-appearing.  HENT:     Head: Normocephalic and atraumatic.  Abdominal:     General: Bowel sounds are normal. There is no distension.     Palpations: Abdomen is soft. There is no hepatomegaly.     Tenderness: There is abdominal tenderness (uncomfortable in RMQ, suprapubic region) in the right lower quadrant. There is no right CVA tenderness or left CVA tenderness. Positive signs include McBurney's sign.     Hernia: No hernia is present.  Skin:    General: Skin is warm and dry.  Neurological:     Mental Status: He is alert.            Assessment & Plan:    See Problem List for Assessment and Plan of chronic medical problems.

## 2018-10-12 MED FILL — FAMOTIDINE 20 MG TABLET: 20 | 30 days supply | Qty: 60 | Fill #5

## 2018-12-01 ENCOUNTER — Other Ambulatory Visit: Payer: Self-pay | Admitting: Internal Medicine

## 2019-01-25 NOTE — Progress Notes (Signed)
Subjective:    Patient ID: Jamie Powell, male    DOB: 09/28/1980, 38 y.o.   MRN: [id number]  HPI He is here for a physical exam.    Right shoulder pain: He has chronic right shoulder pain.  Pain is much worse with working because he is very physical job and in the winter.  Typically he is not need pain medication on a daily basis, but I have prescribed him pain medication during the winter because this is a chronic condition.  He has had multiple surgeries and he has recently seen orthopedics.  The pain is increased and he would like to do pain medication.  He takes it once a day if needed and not daily.  He tries to avoid medication.   GERD: He continues to have GERD.  It is controlled as long as he takes 40 mg of the medication daily.   Anxiety, depression: He is having increased anxiety and depression.  The Effexor stopped working and he stopped taking it.  He would like to try something else.  Alcohol abuse: He has improved his alcohol intake and did not drink at all for a while.  Most recently he did go back to drinking more than he should and he knows he needs to get that controlled.  He is still not drinking on a daily basis, but more than he should and because of anxiety from work.  Medications and allergies reviewed with patient and updated if appropriate.  Patient Active Problem List   Diagnosis Date Noted  . Right lower quadrant abdominal pain 09/20/2018  . Chronic headaches 01/24/2018  . Anxiety and depression 01/24/2018  . Right wrist pain 04/25/2017  . Rash and nonspecific skin eruption 04/25/2017  . Encounter for pain management 01/19/2017  . Hyperlipidemia 01/19/2017  . Instability of right shoulder joint 05/27/2016  . Nonallopathic lesion of thoracic region 05/27/2016  . Nonallopathic lesion of cervical region 05/27/2016  . Nonallopathic lesion of rib cage 05/27/2016  . Chronic right shoulder pain 01/21/2016  . GERD (gastroesophageal reflux disease) 01/21/2016   . Headache 01/21/2016    Current Outpatient Medications on File Prior to Visit  Medication Sig Dispense Refill  . famotidine (PEPCID) 40 MG tablet Take 1 tablet (40 mg total) by mouth 2 (two) times daily. 180 tablet 3  . triamcinolone ointment (KENALOG) 0.5 % Apply 1 application topically 2 (two) times daily. 30 g 3  . famotidine (PEPCID) 20 MG tablet      No current facility-administered medications on file prior to visit.     Past Medical History:  Diagnosis Date  . GERD (gastroesophageal reflux disease)     Past Surgical History:  Procedure Laterality Date  . SHOULDER SURGERY      Social History   Socioeconomic History  . Marital status: Married    Spouse name: [name]  . Number of children: Not on file  . Years of education: Not on file  . Highest education level: Not on file  Occupational History  . Not on file  Social Needs  . Financial resource strain: Not on file  . Food insecurity    Worry: Not on file    Inability: Not on file  . Transportation needs    Medical: Not on file    Non-medical: Not on file  Tobacco Use  . Smoking status: Former Games developer  . Smokeless tobacco: Current User  Substance and Sexual Activity  . Alcohol use: Yes    Comment: occasional  .  Drug use: No  . Sexual activity: Not on file  Lifestyle  . Physical activity    Days per week: Not on file    Minutes per session: Not on file  . Stress: Not on file  Relationships  . Social Herbalist on phone: Not on file    Gets together: Not on file    Attends religious service: Not on file    Active member of club or organization: Not on file    Attends meetings of clubs or organizations: Not on file    Relationship status: Not on file  Other Topics Concern  . Not on file  Social History Narrative   Dealer    Family History  Problem Relation Age of Onset  . Hypertension Maternal Grandfather   . Heart disease Maternal Grandfather   . Cancer Paternal Grandmother         pancreatic cancer  . Stroke Paternal Grandfather   . Diverticulitis Father     Review of Systems  Constitutional: Negative for chills and fever.  Eyes: Negative for visual disturbance.  Respiratory: Negative for cough, shortness of breath and wheezing.   Cardiovascular: Negative for chest pain, palpitations and leg swelling.  Gastrointestinal: Negative for abdominal pain, constipation, diarrhea and nausea.       Gerd  Genitourinary: Negative for difficulty urinating, dysuria and hematuria.  Musculoskeletal: Positive for arthralgias (R shoulder pain).  Skin: Negative for color change and rash.  Neurological: Negative for light-headedness and headaches.  Psychiatric/Behavioral: Positive for dysphoric mood. The patient is nervous/anxious.        Objective:   Vitals:   01/26/19 1421  BP: 120/80  Pulse: (!) 109  Temp: 98.6 F (37 C)  SpO2: 97%   Filed Weights   01/26/19 1421  Weight: 223 lb (101.2 kg)   Body mass index is 30.24 kg/m.  BP Readings from Last 3 Encounters:  01/26/19 120/80  09/20/18 128/70  04/26/18 118/80    Wt Readings from Last 3 Encounters:  01/26/19 223 lb (101.2 kg)  09/20/18 226 lb (102.5 kg)  04/26/18 221 lb 12.8 oz (100.6 kg)     Physical Exam Constitutional: He appears well-developed and well-nourished. No distress.  HENT:  Head: Normocephalic and atraumatic.  Right Ear: External ear normal.  Left Ear: External ear normal.  Mouth/Throat: Oropharynx is clear and moist.  Normal ear canals and TM b/l  Eyes: Conjunctivae and EOM are normal.  Neck: Neck supple. No tracheal deviation present. No thyromegaly present.  No carotid bruit  Cardiovascular: Normal rate, regular rhythm, normal heart sounds and intact distal pulses.   No murmur heard. Pulmonary/Chest: Effort normal and breath sounds normal. No respiratory distress. He has no wheezes. He has no rales.  Abdominal: Soft. He exhibits no distension. There is no tenderness.   Genitourinary: deferred  Musculoskeletal: He exhibits no edema.  Lymphadenopathy:   He has no cervical adenopathy.  Skin: Skin is warm and dry. He is not diaphoretic.  Psychiatric: He has a normal mood and affect. His behavior is normal.         Assessment & Plan:   Physical exam: Screening blood work  deferred-we will hold off for now Immunizations   Flu vaccine today, tdap up to date Exercise   none Weight  Encouraged weight loss Substance abuse   Alcohol - trying to decrease his amount  See Problem List for Assessment and Plan of chronic medical problems.

## 2019-01-26 ENCOUNTER — Encounter: Payer: Self-pay | Admitting: Internal Medicine

## 2019-01-26 ENCOUNTER — Ambulatory Visit (INDEPENDENT_AMBULATORY_CARE_PROVIDER_SITE_OTHER): Payer: 59 | Admitting: Internal Medicine

## 2019-01-26 ENCOUNTER — Encounter: Payer: 59 | Admitting: Internal Medicine

## 2019-01-26 ENCOUNTER — Other Ambulatory Visit: Payer: Self-pay

## 2019-01-26 VITALS — BP 120/80 | HR 109 | Temp 98.6°F | Ht 72.0 in | Wt 223.0 lb

## 2019-01-26 DIAGNOSIS — F419 Anxiety disorder, unspecified: Secondary | ICD-10-CM | POA: Diagnosis not present

## 2019-01-26 DIAGNOSIS — F32A Depression, unspecified: Secondary | ICD-10-CM

## 2019-01-26 DIAGNOSIS — E7849 Other hyperlipidemia: Secondary | ICD-10-CM | POA: Diagnosis not present

## 2019-01-26 DIAGNOSIS — Z0000 Encounter for general adult medical examination without abnormal findings: Secondary | ICD-10-CM | POA: Diagnosis not present

## 2019-01-26 DIAGNOSIS — M25511 Pain in right shoulder: Secondary | ICD-10-CM | POA: Diagnosis not present

## 2019-01-26 DIAGNOSIS — K219 Gastro-esophageal reflux disease without esophagitis: Secondary | ICD-10-CM | POA: Diagnosis not present

## 2019-01-26 DIAGNOSIS — G8929 Other chronic pain: Secondary | ICD-10-CM

## 2019-01-26 DIAGNOSIS — F329 Major depressive disorder, single episode, unspecified: Secondary | ICD-10-CM | POA: Diagnosis not present

## 2019-01-26 DIAGNOSIS — R52 Pain, unspecified: Secondary | ICD-10-CM | POA: Diagnosis not present

## 2019-01-26 DIAGNOSIS — Z23 Encounter for immunization: Secondary | ICD-10-CM | POA: Diagnosis not present

## 2019-01-26 MED ORDER — TRIAMCINOLONE ACETONIDE 0.5 % EX OINT: 1.0000 "application " | TOPICAL_OINTMENT | Freq: Two times a day (BID) | CUTANEOUS | 3 refills | Status: AC

## 2019-01-26 MED ORDER — HYDROCODONE-ACETAMINOPHEN 5-325 MG PO TABS: ORAL_TABLET | ORAL | 0 refills | Status: DC

## 2019-01-26 MED ORDER — FAMOTIDINE 40 MG PO TABS: 40.0000 mg | ORAL_TABLET | Freq: Two times a day (BID) | ORAL | 3 refills | Status: DC

## 2019-01-26 MED ORDER — FLUOXETINE HCL 20 MG PO CAPS: 20.0000 mg | ORAL_CAPSULE | Freq: Every day | ORAL | 1 refills | Status: DC

## 2019-01-26 MED FILL — HYDROCODON-APAP 5-325: 5-325 | 30 days supply | Qty: 30 | Fill #0

## 2019-01-26 MED FILL — TRIAMCINOLONE 0.5% OINTMENT: 0.5 | 15 days supply | Qty: 30 | Fill #0

## 2019-01-26 MED FILL — FAMOTIDINE 40 MG TABLET: 40 | 90 days supply | Qty: 180 | Fill #0

## 2019-01-26 MED FILL — FLUoxetine HCL 20 MG CAPS: 20 | 90 days supply | Qty: 90 | Fill #0

## 2019-01-26 NOTE — Assessment & Plan Note (Signed)
Controlled with Pepcid 40 mg daily-continue

## 2019-01-26 NOTE — Patient Instructions (Addendum)
All other Health Maintenance issues reviewed.   All recommended immunizations and age-appropriate screenings are up-to-date or discussed.  Flu immunization administered today.    Medications reviewed and updated.  Changes include :   Start prozac for your anxiety.  Pain medication only as needed.   Your prescription(s) have been submitted to your pharmacy. Please take as directed and contact our office if you believe you are having problem(s) with the medication(s).      Health Maintenance, Male Adopting a healthy lifestyle and getting preventive care are important in promoting health and wellness. Ask your health care provider about:  The right schedule for you to have regular tests and exams.  Things you can do on your own to prevent diseases and keep yourself healthy. What should I know about diet, weight, and exercise? Eat a healthy diet   Eat a diet that includes plenty of vegetables, fruits, low-fat dairy products, and lean protein.  Do not eat a lot of foods that are high in solid fats, added sugars, or sodium. Maintain a healthy weight Body mass index (BMI) is a measurement that can be used to identify possible weight problems. It estimates body fat based on height and weight. Your health care provider can help determine your BMI and help you achieve or maintain a healthy weight. Get regular exercise Get regular exercise. This is one of the most important things you can do for your health. Most adults should:  Exercise for at least 150 minutes each week. The exercise should increase your heart rate and make you sweat (moderate-intensity exercise).  Do strengthening exercises at least twice a week. This is in addition to the moderate-intensity exercise.  Spend less time sitting. Even light physical activity can be beneficial. Watch cholesterol and blood lipids Have your blood tested for lipids and cholesterol at 36 years of age, then have this test every 5 years. You  may need to have your cholesterol levels checked more often if:  Your lipid or cholesterol levels are high.  You are older than 36 years of age.  You are at high risk for heart disease. What should I know about cancer screening? Many types of cancers can be detected early and may often be prevented. Depending on your health history and family history, you may need to have cancer screening at various ages. This may include screening for:  Colorectal cancer.  Prostate cancer.  Skin cancer.  Lung cancer. What should I know about heart disease, diabetes, and high blood pressure? Blood pressure and heart disease  High blood pressure causes heart disease and increases the risk of stroke. This is more likely to develop in people who have high blood pressure readings, are of African descent, or are overweight.  Talk with your health care provider about your target blood pressure readings.  Have your blood pressure checked: ? Every 3-5 years if you are 39-33 years of age. ? Every year if you are 82 years old or older.  If you are between the ages of 37 and 61 and are a current or former smoker, ask your health care provider if you should have a one-time screening for abdominal aortic aneurysm (AAA). Diabetes Have regular diabetes screenings. This checks your fasting blood sugar level. Have the screening done:  Once every three years after age 9 if you are at a normal weight and have a low risk for diabetes.  More often and at a younger age if you are overweight or have a  high risk for diabetes. What should I know about preventing infection? Hepatitis B If you have a higher risk for hepatitis B, you should be screened for this virus. Talk with your health care provider to find out if you are at risk for hepatitis B infection. Hepatitis C Blood testing is recommended for:  Everyone born from [birth year] through 1963.  Anyone with known risk factors for hepatitis C. Sexually transmitted  infections (STIs)  You should be screened each year for STIs, including gonorrhea and chlamydia, if: ? You are sexually active and are younger than 36 years of age. ? You are older than 36 years of age and your health care provider tells you that you are at risk for this type of infection. ? Your sexual activity has changed since you were last screened, and you are at increased risk for chlamydia or gonorrhea. Ask your health care provider if you are at risk.  Ask your health care provider about whether you are at high risk for HIV. Your health care provider may recommend a prescription medicine to help prevent HIV infection. If you choose to take medicine to prevent HIV, you should first get tested for HIV. You should then be tested every 3 months for as long as you are taking the medicine. Follow these instructions at home: Lifestyle  Do not use any products that contain nicotine or tobacco, such as cigarettes, e-cigarettes, and chewing tobacco. If you need help quitting, ask your health care provider.  Do not use street drugs.  Do not share needles.  Ask your health care provider for help if you need support or information about quitting drugs. Alcohol use  Do not drink alcohol if your health care provider tells you not to drink.  If you drink alcohol: ? Limit how much you have to 0-2 drinks a day. ? Be aware of how much alcohol is in your drink. In the [place] one drink equals one 12 oz bottle of beer (355 mL), one 5 oz glass of wine (148 mL), or one 1 oz glass of hard liquor (44 mL). General instructions  Schedule regular health, dental, and eye exams.  Stay current with your vaccines.  Tell your health care provider if: ? You often feel depressed. ? You have ever been abused or do not feel safe at home. Summary  Adopting a healthy lifestyle and getting preventive care are important in promoting health and wellness.  Follow your health care provider's instructions about  healthy diet, exercising, and getting tested or screened for diseases.  Follow your health care provider's instructions on monitoring your cholesterol and blood pressure. This information is not intended to replace advice given to you by your health care provider. Make sure you discuss any questions you have with your health care provider. Document Released: 08/07/2007 Document Revised: 02/01/2018 Document Reviewed: 02/01/2018 Elsevier Patient Education  2020 [employer].

## 2019-01-26 NOTE — Assessment & Plan Note (Signed)
High cholesterol Will defer blood work for today He will work on his lifestyle for now Overall risk low so I would not put him on medication without trying the lifestyle first Will check lipid panel next year

## 2019-01-26 NOTE — Assessment & Plan Note (Signed)
Chronic in nature Has had multiple surgeries Has seen orthopedics Having increased pain after popping in the shoulder on a couple of different occasions.  Also has a very physical job Requesting pain medication again, which I did prescribe previously We will prescribe Vicodin-1 pill daily as needed.  He does not take on a daily basis and will try to avoid.  Tylenol or ibuprofen for less severe pain Discussed that he cannot drink any alcohol while taking the medication [place] controlled substance database checked.  Ok to fill medication. Will get into see orthopedics next year

## 2019-01-26 NOTE — Assessment & Plan Note (Signed)
Increased anxiety and depression Effexor stopped working and he discontinued it He is interested in trying something else-we will start fluoxetine 20 mg daily Discussed with him that this can be increased if tolerated He has been talking to a family member and that has been very helpful-he has not talked to her recently but will restart Has gone to NA meetings online and will restart that Also discussed considering seeing a therapist

## 2019-01-26 NOTE — Assessment & Plan Note (Signed)
Orthopedics was prescribing his pain medication for him intermittently and he tries to go without it, but his shoulder pain is a chronic issue Will prescribe Vicodin 1 pill daily as needed-he will not use on a daily basis and tries to avoid This is a low dose and relatively low risk Stressed that he cannot drink any alcohol while taking the medication [place] controlled substance database checked.  Ok to fill medication.  Prescription sent to pharmacy

## 2019-01-29 IMAGING — XA DG FLUORO GUIDE NDL PLC/BX
1 series · 1 of 1 positions shown · non-contrast
Comparison: none

CLINICAL DATA: Right shoulder pain

[Series 1: ortho standard · 1 of 1 slices shown]
[im 1/1]
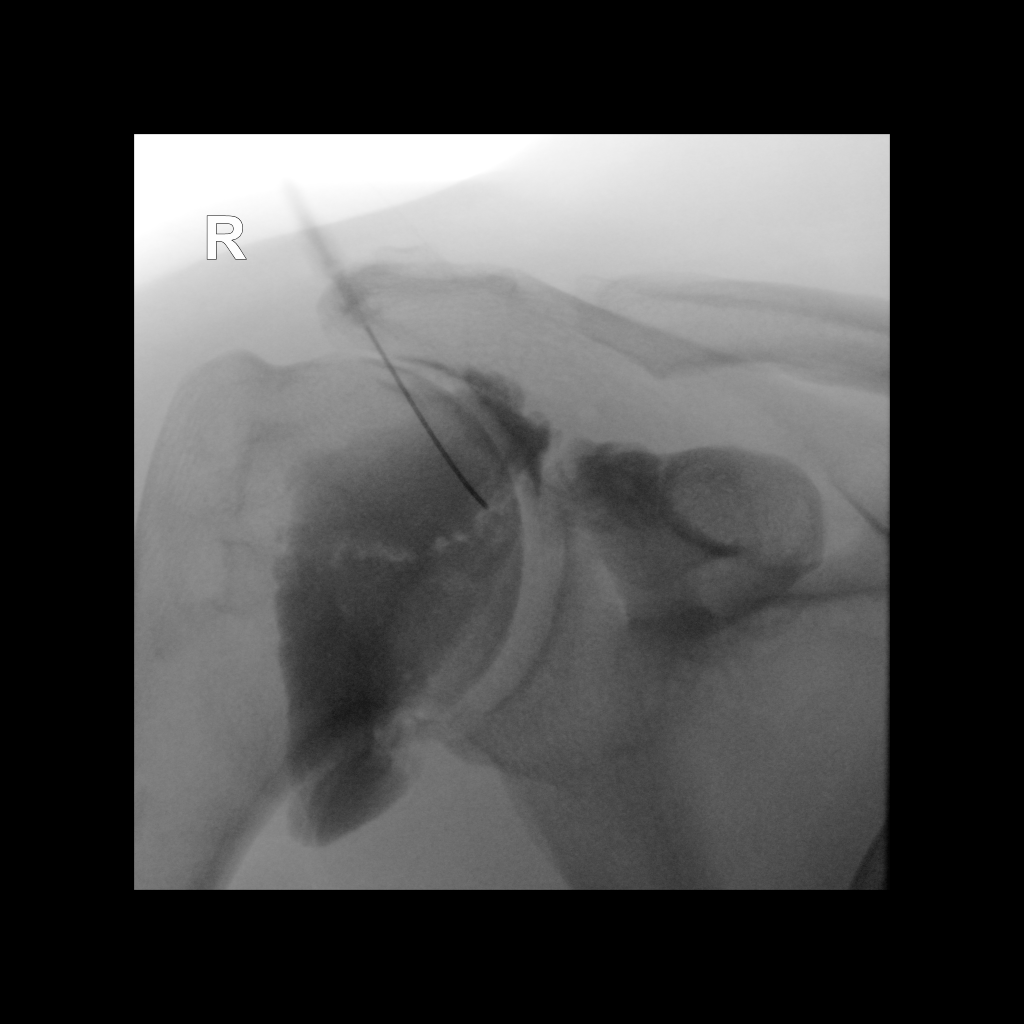

[1 of 1 positions shown; findings below may reference images not displayed]

FLUOROSCOPY TIME:  16 seconds

PROCEDURE:
RIGHT SHOULDER INJECTION UNDER FLUOROSCOPY

An appropriate skin entrance site was determined. The site was
marked, prepped with Betadine, draped in the usual sterile fashion,
and infiltrated locally with buffered Lidocaine. 22 gauge spinal
needle was advanced to the superomedial margin of the humeral head
under intermittent fluoroscopy. 1 ml of Lidocaine injected easily. A
mixture of 0.1 ml Multihance and 20 ml of dilute Isovue 200 was then
used to opacify the right shoulder capsule. No immediate
complication.
IMPRESSION: Technically successful right shoulder injection for MRI.

## 2019-04-30 ENCOUNTER — Ambulatory Visit: Payer: 59 | Admitting: Internal Medicine

## 2019-04-30 MED FILL — FAMOTIDINE 40 MG TABS: 40 | 30 days supply | Qty: 60 | Fill #1

## 2019-05-10 NOTE — Progress Notes (Signed)
Subjective:    Patient ID: Jamie Powell, male    DOB: 1980/12/01, 39 y.o.   MRN: [id number]  HPI The patient is here for follow up of their chronic medical problems, including anxiety and depression, GERD   Kayren Eaves to substance abuse therapy for his alcohol abuse.  He states he has never felt better.  He is currently taking gabapentin 300 mg in the morning and 600 mg at night to help with the craving.  He has also tried melatonin at night.  He has been having difficulty sleeping, which is his main concern.  Anxiety, depression: He is taking the fluoxetine daily as prescribed.  He feels both his anxiety and depression are well controlled.  He feels his heartburn is controlled with his current medication.  Chronic right shoulder pain: He is having increased shoulder pain.  The Vicodin does help.  He was wondering if it be possible to have a higher dose, but get left pelvis.  He needs something that is going to help a little more.  He does not take it often.  His job is very physical and he has pain when he does a certain activity in his job.  Monday he hopes to get a different job, but that is not possible at this time.     Medications and allergies reviewed with patient and updated if appropriate.  Patient Active Problem List   Diagnosis Date Noted  . Right lower quadrant abdominal pain 09/20/2018  . Chronic headaches 01/24/2018  . Anxiety and depression 01/24/2018  . Right wrist pain 04/25/2017  . Rash and nonspecific skin eruption 04/25/2017  . Encounter for pain management 01/19/2017  . Hyperlipidemia 01/19/2017  . Instability of right shoulder joint 05/27/2016  . Nonallopathic lesion of thoracic region 05/27/2016  . Nonallopathic lesion of cervical region 05/27/2016  . Nonallopathic lesion of rib cage 05/27/2016  . Chronic right shoulder pain 01/21/2016  . GERD (gastroesophageal reflux disease) 01/21/2016  . Headache 01/21/2016    Current Outpatient Medications on File  Prior to Visit  Medication Sig Dispense Refill  . famotidine (PEPCID) 40 MG tablet Take 1 tablet (40 mg total) by mouth 2 (two) times daily. 180 tablet 3  . FLUoxetine (PROZAC) 20 MG capsule Take 1 capsule (20 mg total) by mouth daily. 90 capsule 1  . gabapentin (NEURONTIN) 300 MG capsule Take 300 mg by mouth 3 (three) times daily.    Marland Kitchen HYDROcodone-acetaminophen (NORCO/VICODIN) 5-325 MG tablet 1 po qd prn chronic shoulder pain 30 tablet 0  . triamcinolone ointment (KENALOG) 0.5 % Apply 1 application topically 2 (two) times daily. 30 g 3   No current facility-administered medications on file prior to visit.    Past Medical History:  Diagnosis Date  . GERD (gastroesophageal reflux disease)     Past Surgical History:  Procedure Laterality Date  . SHOULDER SURGERY      Social History   Socioeconomic History  . Marital status: Married    Spouse name: [name]  . Number of children: Not on file  . Years of education: Not on file  . Highest education level: Not on file  Occupational History  . Not on file  Tobacco Use  . Smoking status: Former Games developer  . Smokeless tobacco: Current User  Substance and Sexual Activity  . Alcohol use: Yes    Comment: occasional  . Drug use: No  . Sexual activity: Not on file  Other Topics Concern  . Not on file  Social History  Narrative   Dealer   Social Determinants of Health   Financial Resource Strain:   . Difficulty of Paying Living Expenses:   Food Insecurity:   . Worried About Charity fundraiser in the Last Year:   . Arboriculturist in the Last Year:   Transportation Needs:   . Film/video editor (Medical):   Marland Kitchen Lack of Transportation (Non-Medical):   Physical Activity:   . Days of Exercise per Week:   . Minutes of Exercise per Session:   Stress:   . Feeling of Stress :   Social Connections:   . Frequency of Communication with Friends and Family:   . Frequency of Social Gatherings with Friends and Family:   . Attends  Religious Services:   . Active Member of Clubs or Organizations:   . Attends Archivist Meetings:   Marland Kitchen Marital Status:     Family History  Problem Relation Age of Onset  . Hypertension Maternal Grandfather   . Heart disease Maternal Grandfather   . Cancer Paternal Grandmother        pancreatic cancer  . Stroke Paternal Grandfather   . Diverticulitis Father     Review of Systems  Constitutional: Negative for fever.  Respiratory: Negative for cough, shortness of breath and wheezing.   Cardiovascular: Negative for chest pain, palpitations and leg swelling.  Musculoskeletal: Positive for arthralgias.  Neurological: Negative for dizziness, light-headedness and headaches.  Psychiatric/Behavioral: Positive for sleep disturbance. Negative for dysphoric mood. The patient is not nervous/anxious.        Objective:   Vitals:   05/11/19 1602  BP: 126/78  Pulse: 80  Temp: 98.6 F (37 C)  SpO2: 98%   BP Readings from Last 3 Encounters:  05/11/19 126/78  01/26/19 120/80  09/20/18 128/70   Wt Readings from Last 3 Encounters:  05/11/19 223 lb (101.2 kg)  01/26/19 223 lb (101.2 kg)  09/20/18 226 lb (102.5 kg)   Body mass index is 30.24 kg/m.   Physical Exam    Constitutional: Appears well-developed and well-nourished. No distress.  HENT:  Head: Normocephalic and atraumatic.  Neck: Neck supple. No tracheal deviation present. No thyromegaly present.  No cervical lymphadenopathy Cardiovascular: Normal rate, regular rhythm and normal heart sounds.   No murmur heard. No carotid bruit .  No edema Pulmonary/Chest: Effort normal and breath sounds normal. No respiratory distress. No has no wheezes. No rales.  Skin: Skin is warm and dry. Not diaphoretic.  Psychiatric: Normal mood and affect. Behavior is normal.      Assessment & Plan:    See Problem List for Assessment and Plan of chronic medical problems.    This visit occurred during the SARS-CoV-2 public health  emergency.  Safety protocols were in place, including screening questions prior to the visit, additional usage of staff PPE, and extensive cleaning of exam room while observing appropriate contact time as indicated for disinfecting solutions.

## 2019-05-11 ENCOUNTER — Ambulatory Visit (INDEPENDENT_AMBULATORY_CARE_PROVIDER_SITE_OTHER): Payer: Managed Care, Other (non HMO) | Admitting: Internal Medicine

## 2019-05-11 ENCOUNTER — Other Ambulatory Visit: Payer: Self-pay

## 2019-05-11 ENCOUNTER — Encounter: Payer: Self-pay | Admitting: Internal Medicine

## 2019-05-11 VITALS — BP 126/78 | HR 80 | Temp 98.6°F | Ht 72.0 in | Wt 223.0 lb

## 2019-05-11 DIAGNOSIS — M25511 Pain in right shoulder: Secondary | ICD-10-CM

## 2019-05-11 DIAGNOSIS — K219 Gastro-esophageal reflux disease without esophagitis: Secondary | ICD-10-CM | POA: Diagnosis not present

## 2019-05-11 DIAGNOSIS — G4700 Insomnia, unspecified: Secondary | ICD-10-CM | POA: Insufficient documentation

## 2019-05-11 DIAGNOSIS — G8929 Other chronic pain: Secondary | ICD-10-CM

## 2019-05-11 DIAGNOSIS — F329 Major depressive disorder, single episode, unspecified: Secondary | ICD-10-CM

## 2019-05-11 DIAGNOSIS — F419 Anxiety disorder, unspecified: Secondary | ICD-10-CM

## 2019-05-11 DIAGNOSIS — F5101 Primary insomnia: Secondary | ICD-10-CM

## 2019-05-11 DIAGNOSIS — F32A Depression, unspecified: Secondary | ICD-10-CM

## 2019-05-11 MED ORDER — TRAZODONE HCL 50 MG PO TABS: 50.0000 mg | ORAL_TABLET | Freq: Every day | ORAL | 5 refills | Status: DC

## 2019-05-11 MED ORDER — HYDROCODONE-ACETAMINOPHEN 7.5-325 MG PO TABS: 1.0000 | ORAL_TABLET | Freq: Four times a day (QID) | ORAL | 0 refills | Status: DC | PRN

## 2019-05-11 MED FILL — traZODone HCL 50 MG TABS: 50 | 30 days supply | Qty: 30 | Fill #0

## 2019-05-11 MED FILL — HYDROCODON-APAP 7.5-325: 7.5-325 | 4 days supply | Qty: 20 | Fill #0

## 2019-05-11 NOTE — Patient Instructions (Signed)
   Medications reviewed and updated.  Changes include :   Trazodone 50 mg nightly  Your prescription(s) have been submitted to your pharmacy. Please take as directed and contact our office if you believe you are having problem(s) with the medication(s).    Please followup in 6 months

## 2019-05-11 NOTE — Assessment & Plan Note (Signed)
Chronic GERD controlled Continue daily medication - pepcid 40 mg daily  

## 2019-05-11 NOTE — Assessment & Plan Note (Signed)
Acute This is a new problem Anxiety and depression are well controlled He is currently in therapy for substance abuse-alcohol in his body was used to going to sleep after drinking several beers Melatonin and gabapentin do not effective We will try trazodone 50 mg nightly He will let me know if this does not help

## 2019-05-11 NOTE — Assessment & Plan Note (Signed)
Chronic Has had multiple surgeries Has pain with certain very physical activities at work and has been taking hydrocodone only as needed-he tries not to take this, but needs it in order to do his job We will increase his dose to 7.5-325 mg - 1 pill daily as needed He only takes this on occasion and will continue to use it sparingly He understands the risk of addiction, especially with his history of alcohol abuse His wife is here and she will also monitor

## 2019-05-11 NOTE — Assessment & Plan Note (Signed)
Chronic Controlled, stable Continue current dose of medication prozac 20 mg daily 

## 2019-06-11 ENCOUNTER — Encounter: Payer: Self-pay | Admitting: Orthopedic Surgery

## 2019-06-11 ENCOUNTER — Ambulatory Visit (INDEPENDENT_AMBULATORY_CARE_PROVIDER_SITE_OTHER): Payer: Managed Care, Other (non HMO) | Admitting: Orthopedic Surgery

## 2019-06-11 ENCOUNTER — Other Ambulatory Visit: Payer: Self-pay

## 2019-06-11 ENCOUNTER — Ambulatory Visit (INDEPENDENT_AMBULATORY_CARE_PROVIDER_SITE_OTHER): Payer: Managed Care, Other (non HMO)

## 2019-06-11 DIAGNOSIS — M79601 Pain in right arm: Secondary | ICD-10-CM

## 2019-06-11 DIAGNOSIS — M7541 Impingement syndrome of right shoulder: Secondary | ICD-10-CM

## 2019-06-11 MED ORDER — HYDROCODONE-ACETAMINOPHEN 5-325 MG PO TABS: 1.0000 | ORAL_TABLET | Freq: Every evening | ORAL | 0 refills | Status: DC | PRN

## 2019-06-11 MED ORDER — LIDOCAINE HCL 1 % IJ SOLN
5.0000 mL | INTRAMUSCULAR | Status: AC | PRN
  Administered 2019-06-11: 5 mL

## 2019-06-11 MED ORDER — BUPIVACAINE HCL 0.5 % IJ SOLN
9.0000 mL | INTRAMUSCULAR | Status: AC | PRN
  Administered 2019-06-11: 22:00:00 9 mL via INTRA_ARTICULAR

## 2019-06-11 MED ORDER — METHYLPREDNISOLONE ACETATE 40 MG/ML IJ SUSP
40.0000 mg | INTRAMUSCULAR | Status: AC | PRN
  Administered 2019-06-11: 40 mg via INTRA_ARTICULAR

## 2019-06-11 MED FILL — HYDROCODON-APAP 5-325: 5-325 | 15 days supply | Qty: 15 | Fill #0

## 2019-06-11 NOTE — Progress Notes (Signed)
Office Visit Note   Patient: Jamie Powell           Date of Birth: 03/30/1980           MRN: [id number] Visit Date: 06/11/2019 Requested by: Pincus Sanes, MD [address] [place],  [place] [address] PCP: Pincus Sanes, MD  Subjective: Chief Complaint  Patient presents with  . Right Shoulder - Pain    HPI: Jamie Powell is a patient with right shoulder pain.  I have seen him in the past.  He has had multiple shoulder surgeries.  MRI scan of the right shoulder 2019 showed no definite operative pathology.  He is reporting some pain in the neck running down the trapezial region into the posterior aspect of his shoulder in into the posterior aspect of the arm.  He also reports some shoulder pain radiating anteriorly.  States is getting worse.  He does have some type of disability form for work.  We have been through all this before in terms of changing his work demands and requirements at the risk of having him lose his job and today as well as in the past we have decided to forego that for now.  He does take episodic pain medicine.  Recently discharged from alcohol abuse treatment which she states was successful.  He does not state that he is ever been in opioid abuse clinic type situation.              ROS: All systems reviewed are negative as they relate to the chief complaint within the history of present illness.  Patient denies  fevers or chills.   Assessment & Plan: Visit Diagnoses:  1. Right arm pain     Plan: Impression is right shoulder pain possible radiculopathy.  Has had previous cervical spine studies but they are not on the [facility] system.  This is per his report.  I think cervical spine MRI would be the next step because his symptoms today are more aligned with radiculopathy as opposed to intrinsic shoulder pathology.  One-time prescription Norco written and narcotic check pending.  Follow-up for MRI scan as the next study if the combination of Tylenol anti-inflammatories and  subacromial shoulder injection performed today do not help.  Follow-Up Instructions: Return if symptoms worsen or fail to improve.   Orders:  Orders Placed This Encounter  Procedures  . XR Shoulder Right  . XR Cervical Spine 2 or 3 views   Meds ordered this encounter  Medications  . HYDROcodone-acetaminophen (NORCO/VICODIN) 5-325 MG tablet    Sig: Take 1 tablet by mouth at bedtime as needed for moderate pain.    Dispense:  15 tablet    Refill:  0      Procedures: Large Joint Inj on 06/11/2019 9:33 PM Indications: diagnostic evaluation and pain Details: 18 G 1.5 in needle, posterior approach  Arthrogram: No  Medications: 9 mL bupivacaine 0.5 %; 40 mg methylPREDNISolone acetate 40 MG/ML; 5 mL lidocaine 1 % Outcome: tolerated well, no immediate complications Procedure, treatment alternatives, risks and benefits explained, specific risks discussed. Consent was given by the patient. Immediately prior to procedure a time out was called to verify the correct patient, procedure, equipment, support staff and site/side marked as required. Patient was prepped and draped in the usual sterile fashion.       Clinical Data: No additional findings.  Objective: Vital Signs: There were no vitals taken for this visit.  Physical Exam:   Constitutional: Patient appears well-developed HEENT:  Head: Normocephalic Eyes:EOM are normal Neck: Normal range of motion Cardiovascular: Normal rate Pulmonary/chest: Effort normal Neurologic: Patient is alert Skin: Skin is warm Psychiatric: Patient has normal mood and affect    Ortho Exam: Ortho exam demonstrates full active and passive range of motion of the right shoulder.  Impingement signs equivocal on the right negative on the left.  No discrete AC joint tenderness is present.  Negative apprehension relocation testing.  Only slight amount of posterior crepitus posteriorly with internal and external rotation.  Motor sensory function hand is  intact.  Neck range of motion is full.  No definite paresthesias C5-T1.  No muscle atrophy in the right arm region.  Specialty Comments:  No specialty comments available.  Imaging: XR Cervical Spine 2 or 3 views  Result Date: 06/11/2019 AP lateral cervical spine reviewed.  Minimal degenerative disc disease between the vertebral bodies.  Minimal facet arthritis.  There is straightening of the cervical spine.  No acute fracture or dislocation.  XR Shoulder Right  Result Date: 06/11/2019 AP outlet axillary right shoulder reviewed.  Prior distal clavicle excision has been performed.  Minimal narrowing of the acromiohumeral distance.  No significant glenohumeral joint arthritis.  Shoulder is located.  No acute fracture.    PMFS History: Patient Active Problem List   Diagnosis Date Noted  . Insomnia 05/11/2019  . Right lower quadrant abdominal pain 09/20/2018  . Chronic headaches 01/24/2018  . Anxiety and depression 01/24/2018  . Right wrist pain 04/25/2017  . Rash and nonspecific skin eruption 04/25/2017  . Encounter for pain management 01/19/2017  . Hyperlipidemia 01/19/2017  . Instability of right shoulder joint 05/27/2016  . Nonallopathic lesion of thoracic region 05/27/2016  . Nonallopathic lesion of cervical region 05/27/2016  . Nonallopathic lesion of rib cage 05/27/2016  . Chronic right shoulder pain 01/21/2016  . GERD (gastroesophageal reflux disease) 01/21/2016  . Headache 01/21/2016   Past Medical History:  Diagnosis Date  . GERD (gastroesophageal reflux disease)     Family History  Problem Relation Age of Onset  . Hypertension Maternal Grandfather   . Heart disease Maternal Grandfather   . Cancer Paternal Grandmother        pancreatic cancer  . Stroke Paternal Grandfather   . Diverticulitis Father     Past Surgical History:  Procedure Laterality Date  . SHOULDER SURGERY     Social History   Occupational History  . Not on file  Tobacco Use  . Smoking  status: Former Research scientist (life sciences)  . Smokeless tobacco: Current User  Substance and Sexual Activity  . Alcohol use: Yes    Comment: occasional  . Drug use: No  . Sexual activity: Not on file

## 2019-06-13 ENCOUNTER — Other Ambulatory Visit: Payer: Self-pay | Admitting: Orthopedic Surgery

## 2019-11-13 NOTE — Progress Notes (Signed)
Subjective:    Patient ID: Jamie Powell, male    DOB: Jul 08, 1980, 39 y.o.   MRN: [id number]  HPI The patient is here for follow up of their chronic medical problems, including anxiety and depression, chronic shoulder pain, insmonia  He has lost 10 lbs.  He is working on it.    His right shoulder is hurting again - he had a injection in April and it just started to wear off.  He is following with orthopedics.  He is no longer taking any anxiety, depression, or sleep medication.  Overall he is doing well and does not feel that he needs the medications.  He still has the rash on the posterior upper left leg.  It is very itchy.  He does have known eczema in the other areas seem to get better with the steroid cream, but this area is not.  He has been applying it about once a day.  He does itch it.   Medications and allergies reviewed with patient and updated if appropriate.  Patient Active Problem List   Diagnosis Date Noted  . Eczema 11/14/2019  . Insomnia 05/11/2019  . Chronic headaches 01/24/2018  . Anxiety and depression 01/24/2018  . Right wrist pain 04/25/2017  . Encounter for pain management 01/19/2017  . Hyperlipidemia 01/19/2017  . Instability of right shoulder joint 05/27/2016  . Nonallopathic lesion of thoracic region 05/27/2016  . Nonallopathic lesion of cervical region 05/27/2016  . Nonallopathic lesion of rib cage 05/27/2016  . Chronic right shoulder pain 01/21/2016  . GERD (gastroesophageal reflux disease) 01/21/2016  . Headache 01/21/2016    Current Outpatient Medications on File Prior to Visit  Medication Sig Dispense Refill  . famotidine (PEPCID) 40 MG tablet Take 1 tablet (40 mg total) by mouth 2 (two) times daily. 180 tablet 3  . HYDROcodone-acetaminophen (NORCO/VICODIN) 5-325 MG tablet Take 1 tablet by mouth at bedtime as needed for moderate pain. 15 tablet 0  . triamcinolone ointment (KENALOG) 0.5 % Apply 1 application topically 2 (two) times daily. 30 g  3   No current facility-administered medications on file prior to visit.    Past Medical History:  Diagnosis Date  . GERD (gastroesophageal reflux disease)     Past Surgical History:  Procedure Laterality Date  . SHOULDER SURGERY      Social History   Socioeconomic History  . Marital status: Married    Spouse name: [name]  . Number of children: Not on file  . Years of education: Not on file  . Highest education level: Not on file  Occupational History  . Not on file  Tobacco Use  . Smoking status: Former Games developer  . Smokeless tobacco: Current User  Substance and Sexual Activity  . Alcohol use: Yes    Comment: occasional  . Drug use: No  . Sexual activity: Not on file  Other Topics Concern  . Not on file  Social History Narrative   Curator   Social Determinants of Health   Financial Resource Strain:   . Difficulty of Paying Living Expenses: Not on file  Food Insecurity:   . Worried About Programme researcher, broadcasting/film/video in the Last Year: Not on file  . Ran Out of Food in the Last Year: Not on file  Transportation Needs:   . Lack of Transportation (Medical): Not on file  . Lack of Transportation (Non-Medical): Not on file  Physical Activity:   . Days of Exercise per Week: Not on file  . Minutes  of Exercise per Session: Not on file  Stress:   . Feeling of Stress : Not on file  Social Connections:   . Frequency of Communication with Friends and Family: Not on file  . Frequency of Social Gatherings with Friends and Family: Not on file  . Attends Religious Services: Not on file  . Active Member of Clubs or Organizations: Not on file  . Attends Banker Meetings: Not on file  . Marital Status: Not on file    Family History  Problem Relation Age of Onset  . Hypertension Maternal Grandfather   . Heart disease Maternal Grandfather   . Cancer Paternal Grandmother        pancreatic cancer  . Stroke Paternal Grandfather   . Diverticulitis Father     Review of  Systems  Constitutional: Negative for fever.  Respiratory: Negative for cough, shortness of breath and wheezing.   Cardiovascular: Negative for chest pain, palpitations and leg swelling.  Gastrointestinal:       Genella Rife controlled with otc 40 mg BID pepcid  Neurological: Positive for headaches. Negative for light-headedness.  Psychiatric/Behavioral: Negative for dysphoric mood and sleep disturbance. The patient is not nervous/anxious.        Objective:   Vitals:   11/14/19 1551  BP: 112/82  Pulse: 83  Temp: 98.6 F (37 C)  SpO2: 96%   BP Readings from Last 3 Encounters:  11/14/19 112/82  05/11/19 126/78  01/26/19 120/80   Wt Readings from Last 3 Encounters:  11/14/19 220 lb 12.8 oz (100.2 kg)  05/11/19 223 lb (101.2 kg)  01/26/19 223 lb (101.2 kg)   Body mass index is 29.95 kg/m.   Physical Exam    Constitutional: Appears well-developed and well-nourished. No distress.  HENT:  Head: Normocephalic and atraumatic.  Neck: Neck supple. No tracheal deviation present. No thyromegaly present.  No cervical lymphadenopathy Cardiovascular: Normal rate, regular rhythm and normal heart sounds.   No murmur heard. No carotid bruit .  No edema Pulmonary/Chest: Effort normal and breath sounds normal. No respiratory distress. No has no wheezes. No rales.  Skin: Skin is warm and dry. Not diaphoretic.  Dry scaly patch with a base of erythema left posterior upper leg.  No blisters.  No open wound. Psychiatric: Normal mood and affect. Behavior is normal.      Assessment & Plan:    See Problem List for Assessment and Plan of chronic medical problems.    This visit occurred during the SARS-CoV-2 public health emergency.  Safety protocols were in place, including screening questions prior to the visit, additional usage of staff PPE, and extensive cleaning of exam room while observing appropriate contact time as indicated for disinfecting solutions.

## 2019-11-14 ENCOUNTER — Encounter: Payer: Self-pay | Admitting: Internal Medicine

## 2019-11-14 ENCOUNTER — Ambulatory Visit (INDEPENDENT_AMBULATORY_CARE_PROVIDER_SITE_OTHER): Payer: Managed Care, Other (non HMO) | Admitting: Internal Medicine

## 2019-11-14 ENCOUNTER — Other Ambulatory Visit: Payer: Self-pay

## 2019-11-14 VITALS — BP 112/82 | HR 83 | Temp 98.6°F | Wt 220.8 lb

## 2019-11-14 DIAGNOSIS — G8929 Other chronic pain: Secondary | ICD-10-CM

## 2019-11-14 DIAGNOSIS — L309 Dermatitis, unspecified: Secondary | ICD-10-CM | POA: Insufficient documentation

## 2019-11-14 DIAGNOSIS — K219 Gastro-esophageal reflux disease without esophagitis: Secondary | ICD-10-CM

## 2019-11-14 DIAGNOSIS — F5101 Primary insomnia: Secondary | ICD-10-CM | POA: Diagnosis not present

## 2019-11-14 DIAGNOSIS — F419 Anxiety disorder, unspecified: Secondary | ICD-10-CM | POA: Diagnosis not present

## 2019-11-14 DIAGNOSIS — F329 Major depressive disorder, single episode, unspecified: Secondary | ICD-10-CM

## 2019-11-14 DIAGNOSIS — M25511 Pain in right shoulder: Secondary | ICD-10-CM

## 2019-11-14 MED ORDER — CLOBETASOL PROPIONATE 0.05 % EX CREA: 1.0000 "application " | TOPICAL_CREAM | Freq: Two times a day (BID) | CUTANEOUS | 2 refills | Status: DC

## 2019-11-14 NOTE — Assessment & Plan Note (Signed)
Chronic Most of his eczema responds well to the triamcinolone, but the one patch in his posterior left upper leg is not responding well.  He is not always able to apply it twice a day and he does itch it We will try clobetasol-stressed twice daily application If no improvement will refer to dermatology to make sure this is eczema

## 2019-11-14 NOTE — Assessment & Plan Note (Signed)
Chronic Following with orthopedics  

## 2019-11-14 NOTE — Patient Instructions (Addendum)
Try the clobetasol cream twice daily on the rash.     Let me know if that does not help so I can refer you to dermatology.

## 2019-11-14 NOTE — Assessment & Plan Note (Signed)
Resolved No longer on medication and sleeping well Will monitor

## 2019-11-14 NOTE — Assessment & Plan Note (Signed)
Resolved No longer on medication and doing well Will monitor

## 2019-11-14 NOTE — Assessment & Plan Note (Signed)
Chronic Fairly controlled with Pepcid 40 mg twice daily-taking over-the-counter Continue Working on weight loss and working on decreasing his tobacco intake

## 2019-11-16 ENCOUNTER — Telehealth: Payer: Self-pay

## 2019-11-16 MED ORDER — HYDROCODONE-ACETAMINOPHEN 5-325 MG PO TABS: 1.0000 | ORAL_TABLET | Freq: Every evening | ORAL | 0 refills | Status: DC | PRN

## 2019-11-16 MED FILL — CLOBETASOL PROPIONATE 0.05: 0.05 | 30 days supply | Qty: 45 | Fill #0

## 2019-11-16 NOTE — Telephone Encounter (Signed)
sent 

## 2019-11-27 ENCOUNTER — Telehealth: Payer: Self-pay

## 2019-11-27 MED FILL — HYDROCODON-APAP 5-325: 5-325 | 15 days supply | Qty: 15 | Fill #0

## 2019-11-27 NOTE — Telephone Encounter (Signed)
Theotis Barrio (Key: [id number])  Your information has been submitted and will be reviewed by [employer]. You may close this dialog, return to your dashboard, and perform other tasks. An electronic determination will be received in CoverMyMeds within 72-120 hours. You can see the latest determination by locating this request on your dashboard or by reopening this request. You will receive a fax copy of the determination. If Rosann Auerbach has not responded in 120 hours, contact [employer] at [phone].

## 2019-11-29 NOTE — Telephone Encounter (Signed)
[facility] Key: [id number] - [place] Case ID: [id number] Need help? Call us at [phone] Outcome Approvedon October 5 CaseId:[id number];Status:Approved;Review Type:Prior Auth;Coverage Start Date:11/27/2019;Coverage End Date:11/26/2020;

## 2019-12-02 IMAGING — CT CT ABDOMEN AND PELVIS WITH CONTRAST
2 of 4 series · 12 of 46 positions shown, 14 images · IV contrast (iopamidol)
Comparison: None.

CLINICAL DATA: 38-year-old with right lower quadrant abdominal pain
for 3 days. Slight nausea. Evaluate for appendicitis.

EXAM:
CT ABDOMEN AND PELVIS WITH CONTRAST
TECHNIQUE: Multidetector CT imaging of the abdomen and pelvis was performed
using the standard protocol following bolus administration of
intravenous contrast.
CONTRAST:  125mL 3AICSG-500 IOPAMIDOL (3AICSG-500) INJECTION 61%

[Series 2: abd pelvis 5.00 br40 s3 axial · axial · 0.60mm/px · z∈[+1155,+1615]mm · 9 of 112 slices shown, 11 images]
[im 10/112  soft-tissue]
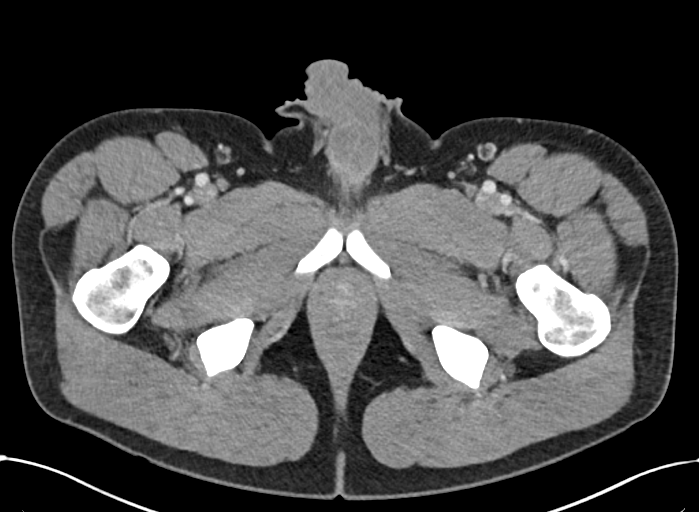
[im 10/112  bone]
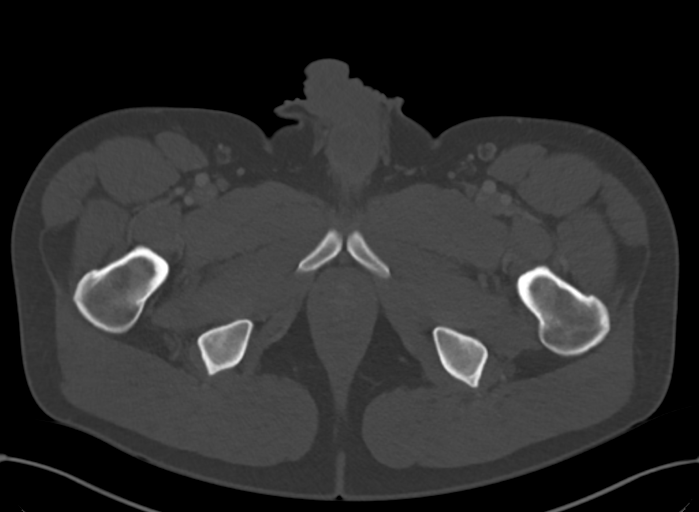
[im 19/112  soft-tissue]
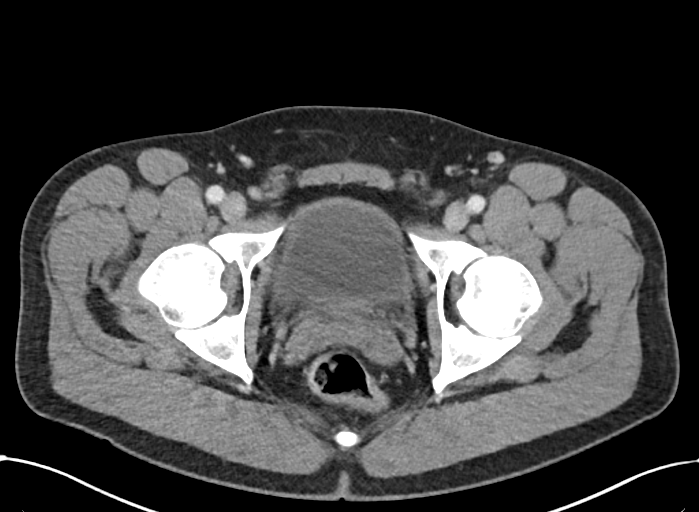
[im 33/112  soft-tissue]
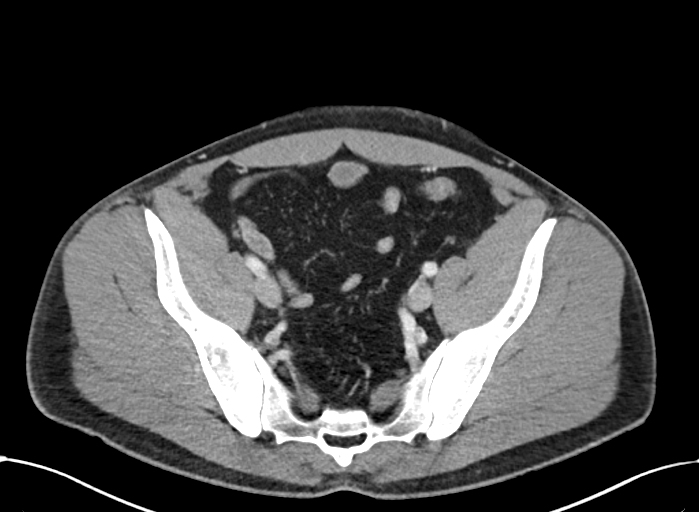
[im 42/112  soft-tissue]
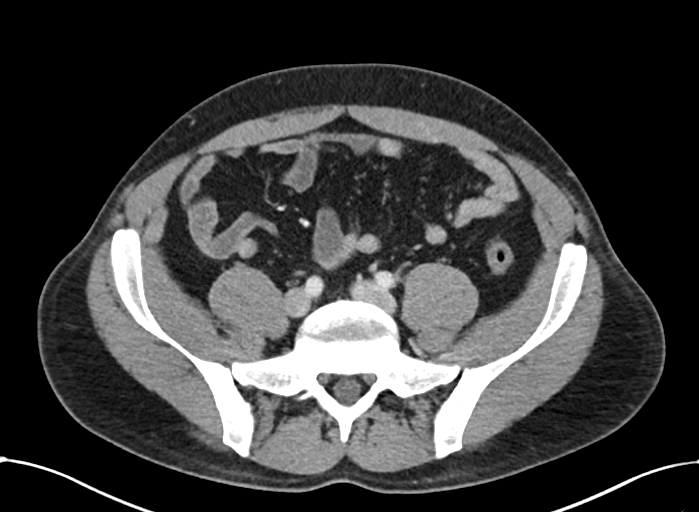
[im 56/112  soft-tissue]
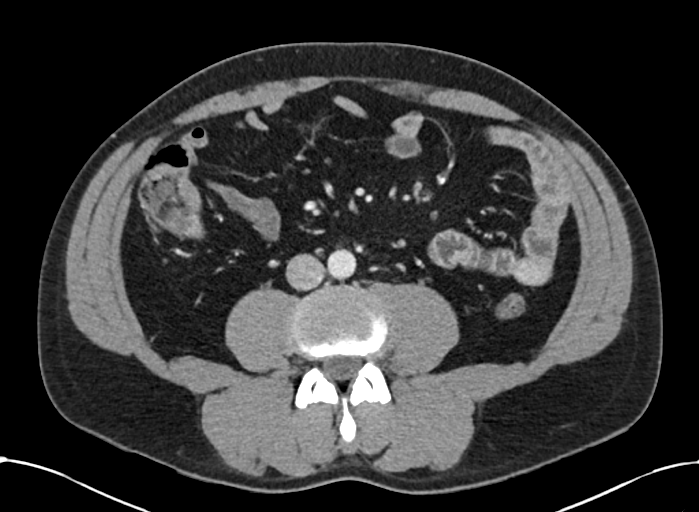
[im 70/112  soft-tissue]
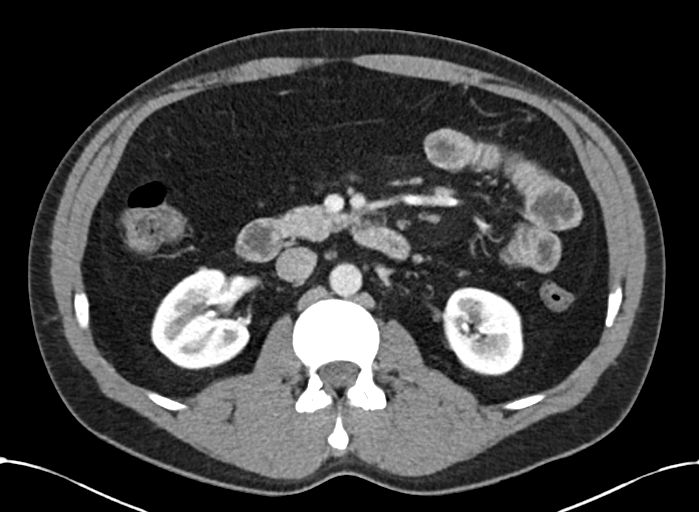
[im 79/112  soft-tissue]
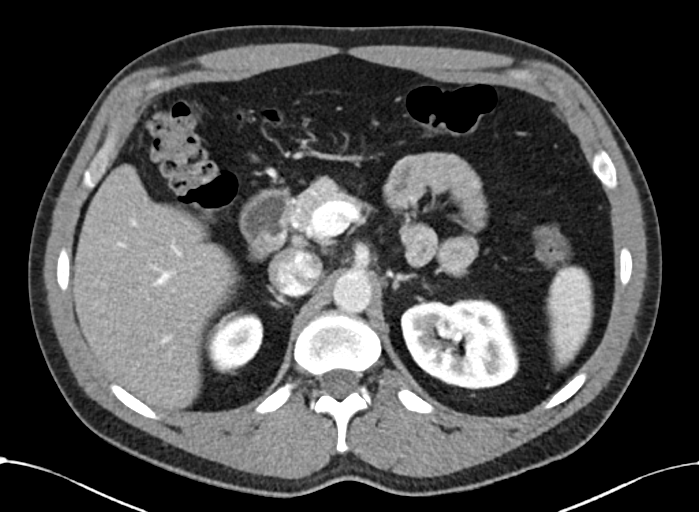
[im 93/112  soft-tissue]
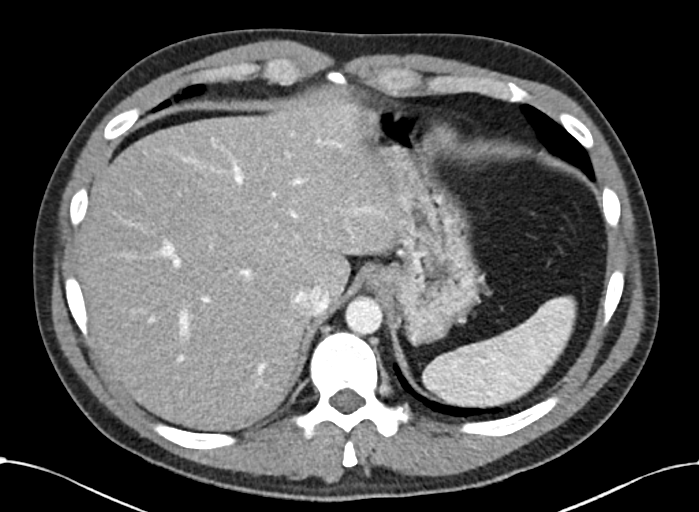
[im 102/112  soft-tissue]
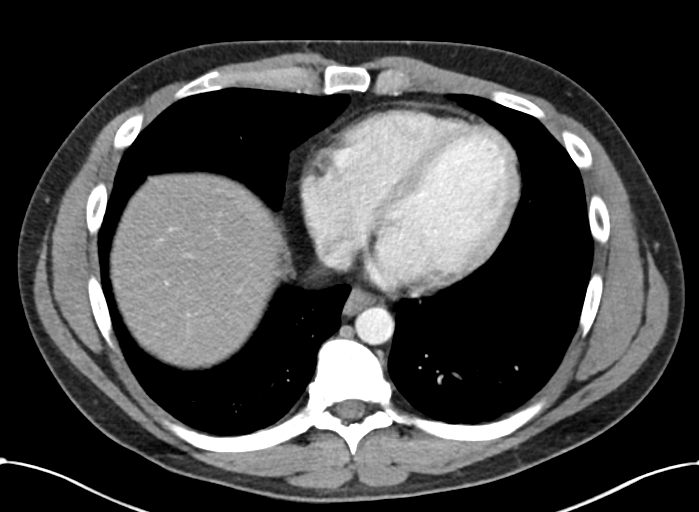
[im 102/112  bone]
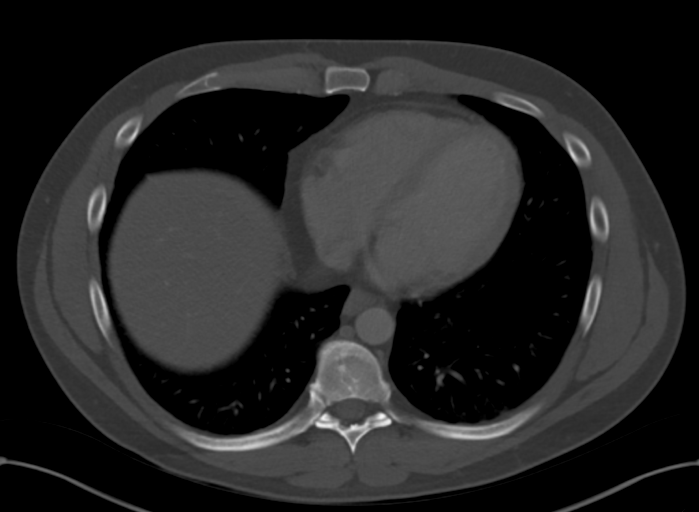

[Series 6: abd pelvis 2.00 br40 s3 cor · coronal · 0.82mm/px · 3 of 145 slices shown]
[im 49/145  soft-tissue]
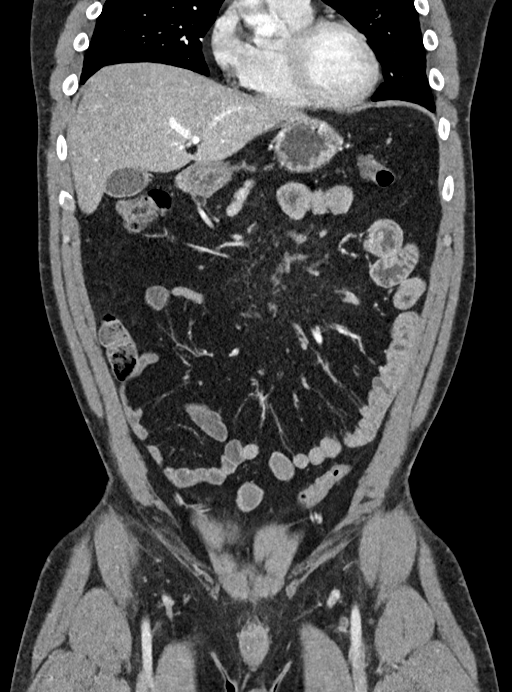
[im 65/145  soft-tissue]
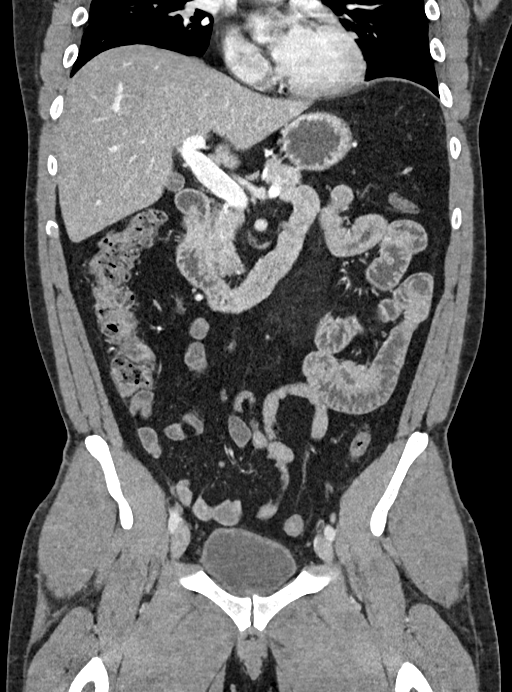
[im 81/145  soft-tissue]
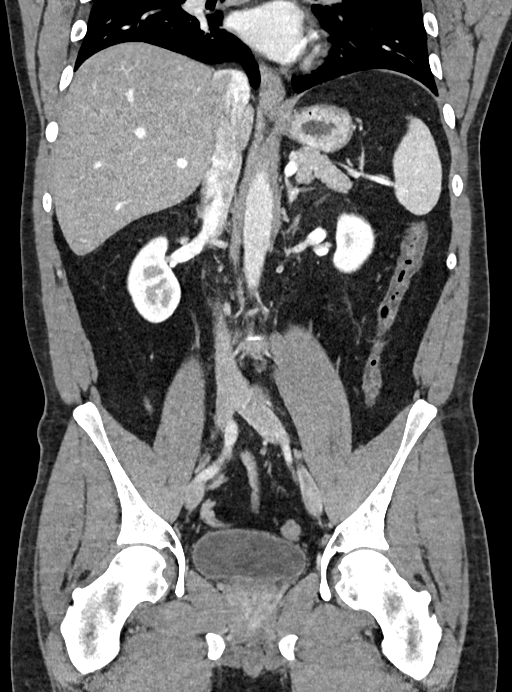

[12 of 46 positions shown; findings below may reference images not displayed]

FINDINGS: Lower chest: Clear lung bases. No significant pleural or pericardial
effusion.

Hepatobiliary: The liver is normal in density without suspicious
focal abnormality. No evidence of gallstones, gallbladder wall
thickening or biliary dilatation.

Pancreas: Unremarkable. No pancreatic ductal dilatation or
surrounding inflammatory changes.

Spleen: Normal in size without focal abnormality.

Adrenals/Urinary Tract: Both adrenal glands appear normal. The
kidneys appear normal without evidence of urinary tract calculus,
suspicious lesion or hydronephrosis. No bladder abnormalities are
seen.

Stomach/Bowel: No evidence of bowel wall thickening, distention or
surrounding inflammatory change. The appendix appears normal.

Vascular/Lymphatic: There are no enlarged abdominal or pelvic lymph
nodes. There are scattered small mesenteric lymph nodes. No
significant vascular findings.

Reproductive: The prostate gland and seminal vesicles appear normal.

Other: No evidence of abdominal wall mass or hernia. No ascites.

Musculoskeletal: No acute or significant osseous findings.
IMPRESSION: No evidence of acute appendicitis or other acute process. No
explanation for the patient's symptoms.

## 2019-12-17 NOTE — Progress Notes (Signed)
Virtual Visit via Video Note  I connected with Jamie Powell on 12/17/19 at  7:45 AM EDT by a video enabled telemedicine application and verified that I am speaking with the correct person using two identifiers.   I discussed the limitations of evaluation and management by telemedicine and the availability of in person appointments. The patient expressed understanding and agreed to proceed.  Present for the visit:  Myself, Dr Cheryll Cockayne, Jamie Powell.  The patient is currently at home and I am in the office.    No referring provider.    History of Present Illness: This is an acute visit for headaches.    For years he has been dealing with cluster headaches.  They typically come in the fall.  He has seen neurologist in the past and has been on oxygen therapy, Topamax and different pain medication.  Nothing really worked great.  Typically he has them for 3-4 weeks in the fall.  His recent episode of cluster headache started last Wednesday.  He had a headache Wednesday, then Friday and then Saturday.  Headaches typically come at night and can last several hours.  His most recent one lasted 5 hours.  He has been taking Tylenol, aspirin or Excedrin.  They help a little, but it does not eliminate the headache.  His pain is located in the right temple and it runs down toward the back of his neck.  He feels like his face is somewhat paralyzed or droopy including his eye.  He does have some sinus issues and has been taking Claritin-D daily.  He describes the pain as a sharp pain in the temple region.  He has associated nausea.  He does not have any numbness, tingling or visual disturbances.    ROS  Per HPI  Social History   Socioeconomic History  . Marital status: Married    Spouse name: [name]  . Number of children: Not on file  . Years of education: Not on file  . Highest education level: Not on file  Occupational History  . Not on file  Tobacco Use  . Smoking status: Former Games developer   . Smokeless tobacco: Current User  Substance and Sexual Activity  . Alcohol use: Yes    Comment: occasional  . Drug use: No  . Sexual activity: Not on file  Other Topics Concern  . Not on file  Social History Narrative   Curator   Social Determinants of Health   Financial Resource Strain:   . Difficulty of Paying Living Expenses: Not on file  Food Insecurity:   . Worried About Programme researcher, broadcasting/film/video in the Last Year: Not on file  . Ran Out of Food in the Last Year: Not on file  Transportation Needs:   . Lack of Transportation (Medical): Not on file  . Lack of Transportation (Non-Medical): Not on file  Physical Activity:   . Days of Exercise per Week: Not on file  . Minutes of Exercise per Session: Not on file  Stress:   . Feeling of Stress : Not on file  Social Connections:   . Frequency of Communication with Friends and Family: Not on file  . Frequency of Social Gatherings with Friends and Family: Not on file  . Attends Religious Services: Not on file  . Active Member of Clubs or Organizations: Not on file  . Attends Banker Meetings: Not on file  . Marital Status: Not on file     Observations/Objective: Appears well in  NAD Breathing normally Skin appears warm and dry  Assessment and Plan:  See Problem List for Assessment and Plan of chronic medical problems.   Follow Up Instructions:    I discussed the assessment and treatment plan with the patient. The patient was provided an opportunity to ask questions and all were answered. The patient agreed with the plan and demonstrated an understanding of the instructions.   The patient was advised to call back or seek an in-person evaluation if the symptoms worsen or if the condition fails to improve as anticipated.    Binnie Rail, MD

## 2019-12-18 ENCOUNTER — Encounter: Payer: Self-pay | Admitting: Internal Medicine

## 2019-12-18 ENCOUNTER — Other Ambulatory Visit: Payer: Self-pay | Admitting: Internal Medicine

## 2019-12-18 ENCOUNTER — Telehealth (INDEPENDENT_AMBULATORY_CARE_PROVIDER_SITE_OTHER): Payer: Managed Care, Other (non HMO) | Admitting: Internal Medicine

## 2019-12-18 DIAGNOSIS — G44019 Episodic cluster headache, not intractable: Secondary | ICD-10-CM

## 2019-12-18 MED ORDER — SUMATRIPTAN 20 MG/ACT NA SOLN: 20.0000 mg | NASAL | 1 refills | Status: DC | PRN

## 2019-12-18 MED ORDER — ONDANSETRON 4 MG PO TBDP: 4.0000 mg | ORAL_TABLET | Freq: Three times a day (TID) | ORAL | 1 refills | Status: DC | PRN

## 2019-12-18 MED FILL — SUMATRIPTAN 20 MG/ACT SOLN: 20 | 8 days supply | Qty: 6 | Fill #0

## 2019-12-18 MED FILL — ONDANSETRON ODT 4 MG TABLET: 4 | 6 days supply | Qty: 20 | Fill #0

## 2019-12-18 NOTE — Assessment & Plan Note (Signed)
Chronic Has intermittent episodes-typically in the fall Currently has been having cluster headaches for the past week.  His episodes typically last 3-4 weeks He has never tried Imitrex-we will try nasal Imitrex to see if that works since that would be easiest-20 mg into left nostril as needed can repeat after 2 hours x 1 Zofran 4 mg every 8 hours as needed for nausea  Think the above does not work we will consider Imitrex injections

## 2020-01-10 ENCOUNTER — Telehealth: Payer: Self-pay | Admitting: Internal Medicine

## 2020-01-10 ENCOUNTER — Other Ambulatory Visit: Payer: Self-pay | Admitting: Internal Medicine

## 2020-01-10 DIAGNOSIS — T300 Burn of unspecified body region, unspecified degree: Secondary | ICD-10-CM

## 2020-01-10 MED ORDER — SILVER SULFADIAZINE 1 % EX CREA: 1.0000 "application " | TOPICAL_CREAM | Freq: Every day | CUTANEOUS | 0 refills | Status: DC

## 2020-01-10 MED ORDER — HYDROCODONE-ACETAMINOPHEN 10-325 MG PO TABS: 1.0000 | ORAL_TABLET | Freq: Four times a day (QID) | ORAL | 0 refills | Status: DC | PRN

## 2020-01-10 MED FILL — HYDROCODON-APAP 10-325: 10-325 | 4 days supply | Qty: 15 | Fill #0

## 2020-01-10 MED FILL — SILVADENE 1% CREAM: 1 | 50 days supply | Qty: 50 | Fill #0

## 2020-01-10 NOTE — Telephone Encounter (Signed)
Meds can be sent to [facility] [address], [place], [place] [address]

## 2020-01-10 NOTE — Telephone Encounter (Signed)
Patient got burn at work. Can you call in some pain meds and cream?   Providers has seen a picture

## 2020-01-31 NOTE — Patient Instructions (Addendum)
We will do labs some other day.      Flu immunization administered today.   Medications changes include :   none     Please followup in 1 year    Health Maintenance, Male Adopting a healthy lifestyle and getting preventive care are important in promoting health and wellness. Ask your health care provider about:  The right schedule for you to have regular tests and exams.  Things you can do on your own to prevent diseases and keep yourself healthy. What should I know about diet, weight, and exercise? Eat a healthy diet   Eat a diet that includes plenty of vegetables, fruits, low-fat dairy products, and lean protein.  Do not eat a lot of foods that are high in solid fats, added sugars, or sodium. Maintain a healthy weight Body mass index (BMI) is a measurement that can be used to identify possible weight problems. It estimates body fat based on height and weight. Your health care provider can help determine your BMI and help you achieve or maintain a healthy weight. Get regular exercise Get regular exercise. This is one of the most important things you can do for your health. Most adults should:  Exercise for at least 150 minutes each week. The exercise should increase your heart rate and make you sweat (moderate-intensity exercise).  Do strengthening exercises at least twice a week. This is in addition to the moderate-intensity exercise.  Spend less time sitting. Even light physical activity can be beneficial. Watch cholesterol and blood lipids Have your blood tested for lipids and cholesterol at 37 years of age, then have this test every 5 years. You may need to have your cholesterol levels checked more often if:  Your lipid or cholesterol levels are high.  You are older than 37 years of age.  You are at high risk for heart disease. What should I know about cancer screening? Many types of cancers can be detected early and may often be prevented. Depending on your  health history and family history, you may need to have cancer screening at various ages. This may include screening for:  Colorectal cancer.  Prostate cancer.  Skin cancer.  Lung cancer. What should I know about heart disease, diabetes, and high blood pressure? Blood pressure and heart disease  High blood pressure causes heart disease and increases the risk of stroke. This is more likely to develop in people who have high blood pressure readings, are of African descent, or are overweight.  Talk with your health care provider about your target blood pressure readings.  Have your blood pressure checked: ? Every 3-5 years if you are 37-101 years of age. ? Every year if you are 39 years old or older.  If you are between the ages of 36 and 12 and are a current or former smoker, ask your health care provider if you should have a one-time screening for abdominal aortic aneurysm (AAA). Diabetes Have regular diabetes screenings. This checks your fasting blood sugar level. Have the screening done:  Once every three years after age 79 if you are at a normal weight and have a low risk for diabetes.  More often and at a younger age if you are overweight or have a high risk for diabetes. What should I know about preventing infection? Hepatitis B If you have a higher risk for hepatitis B, you should be screened for this virus. Talk with your health care provider to find out if you are at risk for  hepatitis B infection. Hepatitis C Blood testing is recommended for:  Everyone born from [birth year] through 1963.  Anyone with known risk factors for hepatitis C. Sexually transmitted infections (STIs)  You should be screened each year for STIs, including gonorrhea and chlamydia, if: ? You are sexually active and are younger than 37 years of age. ? You are older than 37 years of age and your health care provider tells you that you are at risk for this type of infection. ? Your sexual activity has changed  since you were last screened, and you are at increased risk for chlamydia or gonorrhea. Ask your health care provider if you are at risk.  Ask your health care provider about whether you are at high risk for HIV. Your health care provider may recommend a prescription medicine to help prevent HIV infection. If you choose to take medicine to prevent HIV, you should first get tested for HIV. You should then be tested every 3 months for as long as you are taking the medicine. Follow these instructions at home: Lifestyle  Do not use any products that contain nicotine or tobacco, such as cigarettes, e-cigarettes, and chewing tobacco. If you need help quitting, ask your health care provider.  Do not use street drugs.  Do not share needles.  Ask your health care provider for help if you need support or information about quitting drugs. Alcohol use  Do not drink alcohol if your health care provider tells you not to drink.  If you drink alcohol: ? Limit how much you have to 0-2 drinks a day. ? Be aware of how much alcohol is in your drink. In the [place] one drink equals one 12 oz bottle of beer (355 mL), one 5 oz glass of wine (148 mL), or one 1 oz glass of hard liquor (44 mL). General instructions  Schedule regular health, dental, and eye exams.  Stay current with your vaccines.  Tell your health care provider if: ? You often feel depressed. ? You have ever been abused or do not feel safe at home. Summary  Adopting a healthy lifestyle and getting preventive care are important in promoting health and wellness.  Follow your health care provider's instructions about healthy diet, exercising, and getting tested or screened for diseases.  Follow your health care provider's instructions on monitoring your cholesterol and blood pressure. This information is not intended to replace advice given to you by your health care provider. Make sure you discuss any questions you have with your health care  provider. Document Revised: 02/01/2018 Document Reviewed: 02/01/2018 Elsevier Patient Education  2020 [employer].

## 2020-01-31 NOTE — Progress Notes (Signed)
Subjective:    Patient ID: Jamie Powell, male    DOB: [birth year], 39 y.o.   MRN: [id number]  HPI He is here for a physical exam.   He has knee pain from a work related injury that he is dealing with workers comp.  He still has right shoulder pain which can be severe at times.    Medications and allergies reviewed with patient and updated if appropriate.  Patient Active Problem List   Diagnosis Date Noted  . Eczema 11/14/2019  . Insomnia 05/11/2019  . Anxiety and depression 01/24/2018  . Right wrist pain 04/25/2017  . Hyperlipidemia 01/19/2017  . Cluster headaches 01/04/2017  . Instability of right shoulder joint 05/27/2016  . Nonallopathic lesion of thoracic region 05/27/2016  . Nonallopathic lesion of cervical region 05/27/2016  . Nonallopathic lesion of rib cage 05/27/2016  . Chronic right shoulder pain 01/21/2016  . GERD (gastroesophageal reflux disease) 01/21/2016  . Headache 01/21/2016    Current Outpatient Medications on File Prior to Visit  Medication Sig Dispense Refill  . clobetasol cream (TEMOVATE) 0.05 % Apply 1 application topically 2 (two) times daily. 45 g 2  . famotidine (PEPCID) 40 MG tablet Take 1 tablet (40 mg total) by mouth 2 (two) times daily. 180 tablet 3  . ondansetron (ZOFRAN ODT) 4 MG disintegrating tablet Take 1 tablet (4 mg total) by mouth every 8 (eight) hours as needed for nausea or vomiting. 20 tablet 1  . silver sulfADIAZINE (SILVADENE) 1 % cream Apply 1 application topically daily. 50 g 0  . SUMAtriptan (IMITREX) 20 MG/ACT nasal spray Place 1 spray (20 mg total) into the nose every 2 (two) hours as needed for migraine or headache. May repeat in 2 hours if headache persists or recurs. MDD 40 mg 10 each 1  . triamcinolone ointment (KENALOG) 0.5 % Apply 1 application topically 2 (two) times daily. 30 g 3   No current facility-administered medications on file prior to visit.    Past Medical History:  Diagnosis Date  . GERD (gastroesophageal  reflux disease)     Past Surgical History:  Procedure Laterality Date  . SHOULDER SURGERY      Social History   Socioeconomic History  . Marital status: Married    Spouse name: [name]  . Number of children: Not on file  . Years of education: Not on file  . Highest education level: Not on file  Occupational History  . Not on file  Tobacco Use  . Smoking status: Former Games developer  . Smokeless tobacco: Current User  Substance and Sexual Activity  . Alcohol use: Yes    Comment: occasional  . Drug use: No  . Sexual activity: Not on file  Other Topics Concern  . Not on file  Social History Narrative   Curator   Social Determinants of Health   Financial Resource Strain: Not on file  Food Insecurity: Not on file  Transportation Needs: Not on file  Physical Activity: Not on file  Stress: Not on file  Social Connections: Not on file    Family History  Problem Relation Age of Onset  . Hypertension Maternal Grandfather   . Heart disease Maternal Grandfather   . Cancer Paternal Grandmother        pancreatic cancer  . Stroke Paternal Grandfather   . Diverticulitis Father     Review of Systems  Constitutional: Negative for chills and fever.  Respiratory: Negative for cough, shortness of breath and wheezing.   Cardiovascular: Negative  for chest pain, palpitations and leg swelling.  Gastrointestinal: Negative for abdominal pain, blood in stool, constipation, diarrhea and nausea (occ).  Genitourinary: Negative for dysuria and hematuria.  Musculoskeletal: Positive for arthralgias (knee and shoulder).  Skin: Negative for rash.  Neurological: Negative for light-headedness and headaches.  Psychiatric/Behavioral: Negative for dysphoric mood. The patient is nervous/anxious.        Objective:   Vitals:   02/01/20 1535  BP: 122/78  Pulse: (!) 101  Temp: 98.3 F (36.8 C)  SpO2: 97%   Filed Weights   02/01/20 1535  Weight: 228 lb (103.4 kg)   Body mass index is 30.92  kg/m.  BP Readings from Last 3 Encounters:  02/01/20 122/78  11/14/19 112/82  05/11/19 126/78    Wt Readings from Last 3 Encounters:  02/01/20 228 lb (103.4 kg)  11/14/19 220 lb 12.8 oz (100.2 kg)  05/11/19 223 lb (101.2 kg)     Physical Exam Constitutional: He appears well-developed and well-nourished. No distress.  HENT:  Head: Normocephalic and atraumatic.  Right Ear: External ear normal.  Left Ear: External ear normal.  Mouth/Throat: Oropharynx is clear and moist.  Normal ear canals and TM b/l  Eyes: Conjunctivae and EOM are normal.  Neck: Neck supple. No tracheal deviation present. No thyromegaly present.  No carotid bruit  Cardiovascular: Normal rate, regular rhythm, normal heart sounds and intact distal pulses.   No murmur heard. Pulmonary/Chest: Effort normal and breath sounds normal. No respiratory distress. He has no wheezes. He has no rales.  Abdominal: Soft. He exhibits no distension. There is no tenderness.  Genitourinary: deferred  Musculoskeletal: He exhibits no edema.  Lymphadenopathy:   He has no cervical adenopathy.  Skin: Skin is warm and dry. He is not diaphoretic.  Psychiatric: He has a normal mood and affect. His behavior is normal.         Assessment & Plan:   Physical exam: Screening blood work  ordered Immunizations  Had covid, flu vaccine today   Exercise   None Weight  overweight Substance abuse   none  See Problem List for Assessment and Plan of chronic medical problems.   This visit occurred during the SARS-CoV-2 public health emergency.  Safety protocols were in place, including screening questions prior to the visit, additional usage of staff PPE, and extensive cleaning of exam room while observing appropriate contact time as indicated for disinfecting solutions.

## 2020-02-01 ENCOUNTER — Other Ambulatory Visit: Payer: Self-pay | Admitting: Internal Medicine

## 2020-02-01 ENCOUNTER — Ambulatory Visit (INDEPENDENT_AMBULATORY_CARE_PROVIDER_SITE_OTHER): Payer: Managed Care, Other (non HMO) | Admitting: Internal Medicine

## 2020-02-01 ENCOUNTER — Other Ambulatory Visit: Payer: Self-pay

## 2020-02-01 ENCOUNTER — Encounter: Payer: Self-pay | Admitting: Internal Medicine

## 2020-02-01 VITALS — BP 122/78 | HR 101 | Temp 98.3°F | Ht 72.0 in | Wt 228.0 lb

## 2020-02-01 DIAGNOSIS — E7849 Other hyperlipidemia: Secondary | ICD-10-CM

## 2020-02-01 DIAGNOSIS — Z0000 Encounter for general adult medical examination without abnormal findings: Secondary | ICD-10-CM

## 2020-02-01 DIAGNOSIS — G44019 Episodic cluster headache, not intractable: Secondary | ICD-10-CM

## 2020-02-01 DIAGNOSIS — Z23 Encounter for immunization: Secondary | ICD-10-CM | POA: Diagnosis not present

## 2020-02-01 DIAGNOSIS — K219 Gastro-esophageal reflux disease without esophagitis: Secondary | ICD-10-CM | POA: Diagnosis not present

## 2020-02-01 MED ORDER — SUMATRIPTAN SUCCINATE 6 MG/0.5ML ~~LOC~~ SOLN: 6.0000 mg | Freq: Once | SUBCUTANEOUS | 5 refills | Status: DC | PRN

## 2020-02-01 MED ORDER — HYDROCODONE-ACETAMINOPHEN 5-325 MG PO TABS: 1.0000 | ORAL_TABLET | Freq: Four times a day (QID) | ORAL | 0 refills | Status: DC | PRN

## 2020-02-01 NOTE — Assessment & Plan Note (Signed)
Chronic Check lipid panel  Lifestyle controlled Regular exercise and healthy diet encouraged  

## 2020-02-01 NOTE — Assessment & Plan Note (Signed)
Chronic GERD controlled Continue pepcid 20 mg BID  

## 2020-02-01 NOTE — Assessment & Plan Note (Signed)
Chronic - occurs about twice a year imitrex nasal spray worked some but was not great - required two doses Will try imitrex injection with next cluster

## 2020-02-02 MED FILL — SUMATRIPTAN 6 MG/0.5 ML VIA: 6 | 2 days supply | Qty: 2 | Fill #0

## 2020-05-06 ENCOUNTER — Encounter: Payer: Self-pay | Admitting: Internal Medicine

## 2020-05-06 NOTE — Progress Notes (Signed)
Subjective:    Patient ID: Carsen Leaf, male    DOB: [birth year], 40 y.o.   MRN: [id number]  HPI The patient is here for follow up of their chronic medical problems, including cluster headaches, GERD, chronic right shoulder pain   Sinus symptoms started about one week ago.   Has PND, ears are clogged, hoarseness, sinus pain and pressure, nasal congestion and cough/wheezing related to postnasal drip.  He has not had any fevers.  He does have history of sinus infections.        Medications and allergies reviewed with patient and updated if appropriate.  Patient Active Problem List   Diagnosis Date Noted  . Eczema 11/14/2019  . Acute sinus infection 09/13/2017  . Right wrist pain 04/25/2017  . Hyperlipidemia 01/19/2017  . Cluster headaches 01/04/2017  . Instability of right shoulder joint 05/27/2016  . Nonallopathic lesion of thoracic region 05/27/2016  . Nonallopathic lesion of cervical region 05/27/2016  . Nonallopathic lesion of rib cage 05/27/2016  . Chronic right shoulder pain 01/21/2016  . GERD (gastroesophageal reflux disease) 01/21/2016  . Headache 01/21/2016    Current Outpatient Medications on File Prior to Visit  Medication Sig Dispense Refill  . clobetasol cream (TEMOVATE) 0.05 % Apply 1 application topically 2 (two) times daily. 45 g 2  . famotidine (PEPCID) 40 MG tablet Take 1 tablet (40 mg total) by mouth 2 (two) times daily. 180 tablet 3  . ondansetron (ZOFRAN ODT) 4 MG disintegrating tablet Take 1 tablet (4 mg total) by mouth every 8 (eight) hours as needed for nausea or vomiting. 20 tablet 1  . SUMAtriptan (IMITREX) 6 MG/0.5ML SOLN injection Inject 0.5 mLs (6 mg total) into the skin once as needed for up to 1 dose for migraine or headache. May repeat in 1 hour if headache persists or recurs. 4 mL 5  . triamcinolone ointment (KENALOG) 0.5 % Apply 1 application topically 2 (two) times daily. 30 g 3   No current facility-administered medications on file prior  to visit.    Past Medical History:  Diagnosis Date  . GERD (gastroesophageal reflux disease)     Past Surgical History:  Procedure Laterality Date  . SHOULDER SURGERY      Social History   Socioeconomic History  . Marital status: Married    Spouse name: [name]  . Number of children: Not on file  . Years of education: Not on file  . Highest education level: Not on file  Occupational History  . Not on file  Tobacco Use  . Smoking status: Former Games developer  . Smokeless tobacco: Current User  Substance and Sexual Activity  . Alcohol use: Yes    Comment: occasional  . Drug use: No  . Sexual activity: Not on file  Other Topics Concern  . Not on file  Social History Narrative   Curator   Social Determinants of Health   Financial Resource Strain: Not on file  Food Insecurity: Not on file  Transportation Needs: Not on file  Physical Activity: Not on file  Stress: Not on file  Social Connections: Not on file    Family History  Problem Relation Age of Onset  . Hypertension Maternal Grandfather   . Heart disease Maternal Grandfather   . Cancer Paternal Grandmother        pancreatic cancer  . Stroke Paternal Grandfather   . Diverticulitis Father     Review of Systems  Constitutional: Negative for fever.  HENT: Positive for congestion, postnasal drip,  sinus pain and voice change. Negative for ear pain (ears clogged).   Respiratory: Positive for cough (PND) and wheezing. Negative for shortness of breath.   Cardiovascular: Negative for chest pain and palpitations.  Neurological: Positive for light-headedness (mild). Negative for headaches.       Objective:   Vitals:   05/07/20 1611  BP: 128/78  Pulse: 97  Temp: 98.4 F (36.9 C)  SpO2: 99%   BP Readings from Last 3 Encounters:  05/07/20 128/78  02/01/20 122/78  11/14/19 112/82   Wt Readings from Last 3 Encounters:  05/07/20 225 lb (102.1 kg)  02/01/20 228 lb (103.4 kg)  11/14/19 220 lb 12.8 oz (100.2 kg)    Body mass index is 30.52 kg/m.   Physical Exam    GENERAL APPEARANCE: Appears stated age, well appearing, NAD EYES: conjunctiva clear, no icterus HEENT: bilateral tympanic membranes and ear canals normal, oropharynx with no erythema, no thyromegaly, trachea midline, no cervical or supraclavicular lymphadenopathy LUNGS: Clear to auscultation without wheeze or crackles, unlabored breathing, good air entry bilaterally CARDIOVASCULAR: Normal S1,S2 without murmurs, no edema SKIN: Warm, dry     Assessment & Plan:    See Problem List for Assessment and Plan of chronic medical problems.    This visit occurred during the SARS-CoV-2 public health emergency.  Safety protocols were in place, including screening questions prior to the visit, additional usage of staff PPE, and extensive cleaning of exam room while observing appropriate contact time as indicated for disinfecting solutions.

## 2020-05-07 ENCOUNTER — Ambulatory Visit (INDEPENDENT_AMBULATORY_CARE_PROVIDER_SITE_OTHER): Payer: Managed Care, Other (non HMO) | Admitting: Internal Medicine

## 2020-05-07 ENCOUNTER — Other Ambulatory Visit: Payer: Self-pay

## 2020-05-07 VITALS — BP 128/78 | HR 97 | Temp 98.4°F | Ht 72.0 in | Wt 225.0 lb

## 2020-05-07 DIAGNOSIS — J0110 Acute frontal sinusitis, unspecified: Secondary | ICD-10-CM | POA: Diagnosis not present

## 2020-05-07 DIAGNOSIS — K219 Gastro-esophageal reflux disease without esophagitis: Secondary | ICD-10-CM

## 2020-05-07 DIAGNOSIS — M25511 Pain in right shoulder: Secondary | ICD-10-CM | POA: Diagnosis not present

## 2020-05-07 DIAGNOSIS — G8929 Other chronic pain: Secondary | ICD-10-CM

## 2020-05-07 DIAGNOSIS — G44019 Episodic cluster headache, not intractable: Secondary | ICD-10-CM

## 2020-05-07 MED ORDER — HYDROCODONE-ACETAMINOPHEN 5-325 MG PO TABS: 1.0000 | ORAL_TABLET | Freq: Four times a day (QID) | ORAL | 0 refills | Status: DC | PRN

## 2020-05-07 MED ORDER — AMOXICILLIN-POT CLAVULANATE 875-125 MG PO TABS: 1.0000 | ORAL_TABLET | Freq: Two times a day (BID) | ORAL | 0 refills | Status: DC

## 2020-05-07 MED ORDER — SUMATRIPTAN 20 MG/ACT NA SOLN: 20.0000 mg | NASAL | 1 refills | Status: DC | PRN

## 2020-05-07 NOTE — Assessment & Plan Note (Signed)
Acute Likely bacterial  Start Augmentin 875-125 mg BID x 10 day otc cold medications Rest, fluid Call if no improvement  

## 2020-05-07 NOTE — Assessment & Plan Note (Addendum)
Chronic No recent cluster headaches- often has an episode in the spring imitrex nasal spray or injection when needed Will keep imitrex 20 mg/act q 2 hr at work and the Imitrex injection at home-we will use medication as needed

## 2020-05-07 NOTE — Patient Instructions (Addendum)
  Medications changes include :   Augmentin for  Your sinus infection.      Your prescription(s) have been submitted to your pharmacy. Please take as directed and contact our office if you believe you are having problem(s) with the medication(s).

## 2020-05-07 NOTE — Assessment & Plan Note (Signed)
Chronic Worsened with work We will prescribe hydrocodone-acetaminophen 5-325 mg 1 tab every 6 hours as needed for severe pain only [place] controlled substance database checked

## 2020-05-07 NOTE — Assessment & Plan Note (Signed)
Chronic GERD controlled Continue pepcid 40 mg BID   

## 2020-05-08 ENCOUNTER — Other Ambulatory Visit: Payer: Self-pay | Admitting: Internal Medicine

## 2020-05-08 ENCOUNTER — Telehealth: Payer: Self-pay

## 2020-05-08 MED ORDER — AMOXICILLIN-POT CLAVULANATE 875-125 MG PO TABS: 1.0000 | ORAL_TABLET | Freq: Two times a day (BID) | ORAL | 0 refills | Status: DC

## 2020-05-08 MED ORDER — HYDROCODONE-ACETAMINOPHEN 5-325 MG PO TABS: 1.0000 | ORAL_TABLET | Freq: Four times a day (QID) | ORAL | 0 refills | Status: DC | PRN

## 2020-05-08 MED ORDER — SUMATRIPTAN 20 MG/ACT NA SOLN: 20.0000 mg | NASAL | 1 refills | Status: DC | PRN

## 2020-05-08 NOTE — Telephone Encounter (Signed)
sent 

## 2020-05-16 ENCOUNTER — Other Ambulatory Visit (HOSPITAL_BASED_OUTPATIENT_CLINIC_OR_DEPARTMENT_OTHER): Payer: Self-pay

## 2020-08-19 ENCOUNTER — Encounter: Payer: Self-pay | Admitting: Internal Medicine

## 2020-08-19 NOTE — Progress Notes (Signed)
Subjective:    Patient ID: Jamie Powell, male    DOB: September 02, 1980, 39 y.o.   MRN: [id number]  HPI The patient is here for follow up of their chronic medical problems, including chronic right shoulder pain, possible sinus infection  ?  Sinus infection: He is experiencing significant sinus pressure and congestion that is not improving with taking Allegra-D daily.  He does have a history of sinus infections.  Continues to have right shoulder pain-pain medication is not working as well.  His job is very physical and unfortunately at this time he cannot go to a different job.  He was wondering about trying something slightly stronger so that he can continue to work.    Medications and allergies reviewed with patient and updated if appropriate.  Patient Active Problem List   Diagnosis Date Noted   Eczema 11/14/2019   Acute sinus infection 09/13/2017   Right wrist pain 04/25/2017   Hyperlipidemia 01/19/2017   Cluster headaches 01/04/2017   Instability of right shoulder joint 05/27/2016   Nonallopathic lesion of thoracic region 05/27/2016   Nonallopathic lesion of cervical region 05/27/2016   Nonallopathic lesion of rib cage 05/27/2016   Chronic right shoulder pain 01/21/2016   GERD (gastroesophageal reflux disease) 01/21/2016   Headache 01/21/2016    Current Outpatient Medications on File Prior to Visit  Medication Sig Dispense Refill   clobetasol cream (TEMOVATE) 0.05 % Apply 1 application topically 2 (two) times daily. 45 g 2   famotidine (PEPCID) 40 MG tablet Take 1 tablet (40 mg total) by mouth 2 (two) times daily. 180 tablet 3   ondansetron (ZOFRAN-ODT) 4 MG disintegrating tablet DISSOLVE 1 TABLET BY MOUTH EVERY 8 HOURS AS NEEDED FOR NAUSEA AND/OR VOMITING 20 tablet 1   SUMAtriptan (IMITREX) 20 MG/ACT nasal spray PLACE 1 SPRAY INTO THE NOSE AS NEEDED FOR MIGRAINE OR HEADACHE. MAY REPEAT ONCE IN 2 HOURS IF HEADACHE PERSISTS OR RECURS. MAX OF 2 SPRAYS PER DAY 10 each 1    SUMAtriptan (IMITREX) 6 MG/0.5ML SOLN injection INJECT 0.5 MLS INTO THE SKIN ONCE AS NEEDED FOR UP TO 1 DOSE FOR MIGRAINE OR HEADACHE. MAY REPEAT IN 1 HOUR IF HEADACHE PERSISTS OR RECURS 4 mL 5   triamcinolone ointment (KENALOG) 0.5 % Apply 1 application topically 2 (two) times daily. 30 g 3   No current facility-administered medications on file prior to visit.    Past Medical History:  Diagnosis Date   GERD (gastroesophageal reflux disease)     Past Surgical History:  Procedure Laterality Date   SHOULDER SURGERY      Social History   Socioeconomic History   Marital status: Married    Spouse name: [name]   Number of children: Not on file   Years of education: Not on file   Highest education level: Not on file  Occupational History   Not on file  Tobacco Use   Smoking status: Former    Pack years: 0.00   Smokeless tobacco: Current  Substance and Sexual Activity   Alcohol use: Yes    Comment: occasional   Drug use: No   Sexual activity: Not on file  Other Topics Concern   Not on file  Social History Narrative   Curator   Social Determinants of Health   Financial Resource Strain: Not on file  Food Insecurity: Not on file  Transportation Needs: Not on file  Physical Activity: Not on file  Stress: Not on file  Social Connections: Not on file  Family History  Problem Relation Age of Onset   Hypertension Maternal Grandfather    Heart disease Maternal Grandfather    Cancer Paternal Grandmother        pancreatic cancer   Stroke Paternal Grandfather    Diverticulitis Father     Review of Systems  Constitutional:  Negative for fever.  HENT:  Positive for congestion, sinus pressure and sinus pain. Negative for sore throat.   Respiratory:  Negative for cough, shortness of breath and wheezing.   Musculoskeletal:  Positive for arthralgias.  Neurological:  Negative for dizziness and headaches.      Objective:   Vitals:   08/20/20 1529  BP: 118/78  Pulse: 83   SpO2: 98%   BP Readings from Last 3 Encounters:  08/20/20 118/78  05/07/20 128/78  02/01/20 122/78   Wt Readings from Last 3 Encounters:  08/20/20 222 lb (100.7 kg)  05/07/20 225 lb (102.1 kg)  02/01/20 228 lb (103.4 kg)   Body mass index is 30.11 kg/m.   Physical Exam    Constitutional: Appears well-developed and well-nourished. No distress.  HENT:  Head: Normocephalic and atraumatic.  Neck: Neck supple. No tracheal deviation present. No thyromegaly present.  No cervical lymphadenopathy Cardiovascular: Normal rate, regular rhythm and normal heart sounds.   No murmur heard. No carotid bruit .  No edema Pulmonary/Chest: Effort normal and breath sounds normal. No respiratory distress. No has no wheezes. No rales.  Skin: Skin is warm and dry. Not diaphoretic.  Psychiatric: Normal mood and affect. Behavior is normal.      Assessment & Plan:    See Problem List for Assessment and Plan of chronic medical problems.    This visit occurred during the SARS-CoV-2 public health emergency.  Safety protocols were in place, including screening questions prior to the visit, additional usage of staff PPE, and extensive cleaning of exam room while observing appropriate contact time as indicated for disinfecting solutions.

## 2020-08-20 ENCOUNTER — Other Ambulatory Visit: Payer: Self-pay

## 2020-08-20 ENCOUNTER — Ambulatory Visit (INDEPENDENT_AMBULATORY_CARE_PROVIDER_SITE_OTHER): Payer: Managed Care, Other (non HMO) | Admitting: Internal Medicine

## 2020-08-20 VITALS — BP 118/78 | HR 83 | Ht 72.0 in | Wt 222.0 lb

## 2020-08-20 DIAGNOSIS — G8929 Other chronic pain: Secondary | ICD-10-CM

## 2020-08-20 DIAGNOSIS — J0110 Acute frontal sinusitis, unspecified: Secondary | ICD-10-CM

## 2020-08-20 DIAGNOSIS — M25511 Pain in right shoulder: Secondary | ICD-10-CM | POA: Diagnosis not present

## 2020-08-20 MED ORDER — HYDROCODONE-ACETAMINOPHEN 7.5-325 MG PO TABS: 1.0000 | ORAL_TABLET | Freq: Four times a day (QID) | ORAL | 0 refills | Status: DC | PRN

## 2020-08-20 MED ORDER — DOXYCYCLINE HYCLATE 100 MG PO TABS
100.0000 mg | ORAL_TABLET | Freq: Two times a day (BID) | ORAL | 0 refills | Status: DC
Start: 2020-08-20 — End: 2020-11-30

## 2020-08-20 NOTE — Patient Instructions (Addendum)
      Medications changes include :   Vicodin 7.5-325 mg as needed.  Doxycyline for the sinus infection   Your prescription(s) have been submitted to your pharmacy. Please take as directed and contact our office if you believe you are having problem(s) with the medication(s).    Please followup in 3 months

## 2020-08-20 NOTE — Assessment & Plan Note (Signed)
Chronic Extensive history of right shoulder pain, 3 surgeries in the past Has seen orthopedics and done several ejections, but unfortunately pain is chronic and will not improve.  He is in a job where he has little choice but to do a lot of physical activity and eventually will be able to leave that job, but unfortunately at this point he cannot Will increase hydrocodone to 7.5-325 mg daily as needed.  He tries not to take the pain medication frequently and only takes it when the pain is severe He understands the side effects and risks of pain medication and we both feel the benefits outweigh the side effects at this time Follow-up in 3 months

## 2020-08-20 NOTE — Assessment & Plan Note (Signed)
Acute ?Likely bacterial  ?Start doxycycline 100 mg BID x 10 day ?otc cold medications ?Rest, fluid ?Call if no improvement ? ?

## 2020-11-30 NOTE — Progress Notes (Signed)
Virtual Visit via Video Note  I connected with Jamie Powell on 12/01/20 at  3:20 PM EDT by a video enabled telemedicine application and verified that I am speaking with the correct person using two identifiers.   I discussed the limitations of evaluation and management by telemedicine and the availability of in person appointments. The patient expressed understanding and agreed to proceed.  Present for the visit:  Myself, Dr Cheryll Cockayne, Jamie Powell.  The patient is currently at home and I am in the office.    No referring provider.    History of Present Illness: He is here for an acute visit for cold symptoms.  His symptoms started several days ago  He is experiencing congestion, sinus pain, intermittent left ear feeling clogged, cough that has progressed and is now productive of green sputum and headaches.  He does have a history of recurring sinus infections.  He has not had any fevers or shortness of breath.  He has tried taking allegra D.  Covid test negative.   He wonders about seeing ENT because he has chronic sinus issues and recurrent sinus infections.  1 nasal passageway will feel clogged and then the other 1 will and he goes back and forth.  Review of Systems  Constitutional:  Negative for fever.  HENT:  Positive for congestion and sinus pain. Negative for ear pain (intermittent left ear gets clogged) and sore throat.   Respiratory:  Positive for cough and sputum production (green sputum). Negative for shortness of breath and wheezing.   Neurological:  Positive for headaches.     Social History   Socioeconomic History   Marital status: Married    Spouse name: [name]   Number of children: Not on file   Years of education: Not on file   Highest education level: Not on file  Occupational History   Not on file  Tobacco Use   Smoking status: Former   Smokeless tobacco: Current  Substance and Sexual Activity   Alcohol use: Yes    Comment: occasional   Drug use:  No   Sexual activity: Not on file  Other Topics Concern   Not on file  Social History Narrative   Curator   Social Determinants of Health   Financial Resource Strain: Not on file  Food Insecurity: Not on file  Transportation Needs: Not on file  Physical Activity: Not on file  Stress: Not on file  Social Connections: Not on file     Observations/Objective: Appears well in NAD Breathing normally, speaking in full sentences  Assessment and Plan:  See Problem List for Assessment and Plan of chronic medical problems.   Follow Up Instructions:    I discussed the assessment and treatment plan with the patient. The patient was provided an opportunity to ask questions and all were answered. The patient agreed with the plan and demonstrated an understanding of the instructions.   The patient was advised to call back or seek an in-person evaluation if the symptoms worsen or if the condition fails to improve as anticipated.    Pincus Sanes, MD

## 2020-12-01 ENCOUNTER — Telehealth (INDEPENDENT_AMBULATORY_CARE_PROVIDER_SITE_OTHER): Payer: Managed Care, Other (non HMO) | Admitting: Internal Medicine

## 2020-12-01 ENCOUNTER — Encounter: Payer: Self-pay | Admitting: Internal Medicine

## 2020-12-01 DIAGNOSIS — J0110 Acute frontal sinusitis, unspecified: Secondary | ICD-10-CM | POA: Diagnosis not present

## 2020-12-01 DIAGNOSIS — J329 Chronic sinusitis, unspecified: Secondary | ICD-10-CM | POA: Diagnosis not present

## 2020-12-01 MED ORDER — CLOBETASOL PROPIONATE 0.05 % EX CREA: 1.0000 "application " | TOPICAL_CREAM | Freq: Two times a day (BID) | CUTANEOUS | 2 refills | Status: DC

## 2020-12-01 MED ORDER — HYDROCODONE-ACETAMINOPHEN 7.5-325 MG PO TABS: 1.0000 | ORAL_TABLET | Freq: Four times a day (QID) | ORAL | 0 refills | Status: DC | PRN

## 2020-12-01 MED ORDER — DOXYCYCLINE HYCLATE 100 MG PO TABS: 100.0000 mg | ORAL_TABLET | Freq: Two times a day (BID) | ORAL | 0 refills | Status: AC

## 2020-12-01 NOTE — Assessment & Plan Note (Signed)
History of frequent sinus infections Also sensation of 1 nasal passageways being clogged alternating with the other He is interested in seeing an ENT for further evaluation

## 2020-12-01 NOTE — Assessment & Plan Note (Signed)
Acute Likely bacterial  Start doxycycline 100 mg BID x 10 day otc cold medications, allergy medication-Allegra-D Rest, fluid Call if no improvement

## 2021-02-01 ENCOUNTER — Encounter: Payer: Self-pay | Admitting: Internal Medicine

## 2021-02-01 NOTE — Progress Notes (Signed)
Subjective:    Patient ID: Jamie Powell, male    DOB: 1980/08/11, 40 y.o.   MRN: [id number]   This visit occurred during the SARS-CoV-2 public health emergency.  Safety protocols were in place, including screening questions prior to the visit, additional usage of staff PPE, and extensive cleaning of exam room while observing appropriate contact time as indicated for disinfecting solutions.   HPI He is here for a physical exam.   He still has sinus pain and pressure-the antibiotic worked, but his symptoms have come back.  He does have an appointment to see ENT because of chronic sinus infections.  His sister has a similar problems and just had sinus surgery.  He has been using the pain medication as needed and does need a refill.  Medications and allergies reviewed with patient and updated if appropriate.  Patient Active Problem List   Diagnosis Date Noted   Frequent sinus infections 12/01/2020   Eczema 11/14/2019   Acute sinus infection 09/13/2017   Hyperlipidemia 01/19/2017   Cluster headaches 01/04/2017   Instability of right shoulder joint 05/27/2016   Nonallopathic lesion of thoracic region 05/27/2016   Nonallopathic lesion of cervical region 05/27/2016   Nonallopathic lesion of rib cage 05/27/2016   Chronic right shoulder pain 01/21/2016   GERD (gastroesophageal reflux disease) 01/21/2016   Headache 01/21/2016    Current Outpatient Medications on File Prior to Visit  Medication Sig Dispense Refill   clobetasol cream (TEMOVATE) 0.05 % Apply 1 application topically 2 (two) times daily. 45 g 2   famotidine (PEPCID) 40 MG tablet Take 1 tablet (40 mg total) by mouth 2 (two) times daily. 180 tablet 3   SUMAtriptan (IMITREX) 20 MG/ACT nasal spray PLACE 1 SPRAY INTO THE NOSE AS NEEDED FOR MIGRAINE OR HEADACHE. MAY REPEAT ONCE IN 2 HOURS IF HEADACHE PERSISTS OR RECURS. MAX OF 2 SPRAYS PER DAY 10 each 1   triamcinolone ointment (KENALOG) 0.5 % Apply 1 application topically 2  (two) times daily. 30 g 3   SUMAtriptan (IMITREX) 6 MG/0.5ML SOLN injection INJECT 0.5 MLS INTO THE SKIN ONCE AS NEEDED FOR UP TO 1 DOSE FOR MIGRAINE OR HEADACHE. MAY REPEAT IN 1 HOUR IF HEADACHE PERSISTS OR RECURS 4 mL 5   No current facility-administered medications on file prior to visit.    Past Medical History:  Diagnosis Date   GERD (gastroesophageal reflux disease)     Past Surgical History:  Procedure Laterality Date   SHOULDER SURGERY      Social History   Socioeconomic History   Marital status: Married    Spouse name: [name]   Number of children: Not on file   Years of education: Not on file   Highest education level: Not on file  Occupational History   Not on file  Tobacco Use   Smoking status: Former   Smokeless tobacco: Current  Substance and Sexual Activity   Alcohol use: Yes    Comment: occasional   Drug use: No   Sexual activity: Not on file  Other Topics Concern   Not on file  Social History Narrative   Curatormechanic   Social Determinants of Health   Financial Resource Strain: Not on file  Food Insecurity: Not on file  Transportation Needs: Not on file  Physical Activity: Not on file  Stress: Not on file  Social Connections: Not on file    Family History  Problem Relation Age of Onset   Hypertension Maternal Grandfather    Heart disease  Maternal Grandfather    Cancer Paternal Grandmother        pancreatic cancer   Stroke Paternal Grandfather    Diverticulitis Father     Review of Systems  Constitutional:  Negative for chills and fever.  HENT:  Positive for sinus pressure and sinus pain. Negative for congestion.   Eyes:  Negative for visual disturbance.  Respiratory:  Negative for cough, shortness of breath and wheezing.   Cardiovascular:  Negative for chest pain, palpitations and leg swelling.  Gastrointestinal:  Negative for abdominal pain, blood in stool, constipation, diarrhea and nausea.       GERD controlled  Genitourinary:  Negative  for difficulty urinating, dysuria and hematuria.  Musculoskeletal:  Positive for arthralgias.  Skin:  Positive for rash (on leg - chronic). Negative for color change.  Neurological:  Positive for headaches (sinus headaches). Negative for light-headedness.  Psychiatric/Behavioral:  Negative for dysphoric mood. The patient is not nervous/anxious.       Objective:   Vitals:   02/02/21 1529  BP: 120/74  Pulse: 97  Temp: 98.4 F (36.9 C)  SpO2: 98%   Filed Weights   02/02/21 1529  Weight: 223 lb (101.2 kg)   Body mass index is 30.24 kg/m.  BP Readings from Last 3 Encounters:  02/02/21 120/74  08/20/20 118/78  05/07/20 128/78    Wt Readings from Last 3 Encounters:  02/02/21 223 lb (101.2 kg)  08/20/20 222 lb (100.7 kg)  05/07/20 225 lb (102.1 kg)     Physical Exam Constitutional: He appears well-developed and well-nourished. No distress.  HENT:  Head: Normocephalic and atraumatic.  Right Ear: External ear normal.  Left Ear: External ear normal.  Mouth/Throat: Oropharynx is clear and moist.  Normal ear canals and TM b/l  Eyes: Conjunctivae and EOM are normal.  Neck: Neck supple. No tracheal deviation present. No thyromegaly present.  No carotid bruit  Cardiovascular: Normal rate, regular rhythm, normal heart sounds and intact distal pulses.   No murmur heard. Pulmonary/Chest: Effort normal and breath sounds normal. No respiratory distress. He has no wheezes. He has no rales.  Abdominal: Soft. He exhibits no distension. There is no tenderness.  Genitourinary: deferred  Musculoskeletal: He exhibits no edema.  Lymphadenopathy:   He has no cervical adenopathy.  Skin: Skin is warm and dry. He is not diaphoretic.  Psychiatric: He has a normal mood and affect. His behavior is normal.         Assessment & Plan:   Physical exam: Screening blood work  ordered Exercise   active at work Weight could benefit from weight loss, which she knows Substance abuse    smoking   Reviewed recommended immunizations.  Flu vaccine today   Health Maintenance  Topic Date Due   INFLUENZA VACCINE  09/22/2020   COVID-19 Vaccine (2 - Pfizer series) 02/18/2021 (Originally 05/24/2020)   Hepatitis C Screening  08/20/2021 (Originally 07/05/2000)   TETANUS/TDAP  01/20/2026   HIV Screening  Completed   Pneumococcal Vaccine 72-12 Years old  Aged Out   HPV VACCINES  Aged Out     See Problem List for Assessment and Plan of chronic medical problems.

## 2021-02-01 NOTE — Patient Instructions (Addendum)
Flu immunization administered today.     Blood work was ordered.     Medications changes include :   antibiotic for sinuses  Your prescription(s) have been submitted to your pharmacy. Please take as directed and contact our office if you believe you are having problem(s) with the medication(s).   Please followup in 1 year   Health Maintenance, Male Adopting a healthy lifestyle and getting preventive care are important in promoting health and wellness. Ask your health care provider about: The right schedule for you to have regular tests and exams. Things you can do on your own to prevent diseases and keep yourself healthy. What should I know about diet, weight, and exercise? Eat a healthy diet  Eat a diet that includes plenty of vegetables, fruits, low-fat dairy products, and lean protein. Do not eat a lot of foods that are high in solid fats, added sugars, or sodium. Maintain a healthy weight Body mass index (BMI) is a measurement that can be used to identify possible weight problems. It estimates body fat based on height and weight. Your health care provider can help determine your BMI and help you achieve or maintain a healthy weight. Get regular exercise Get regular exercise. This is one of the most important things you can do for your health. Most adults should: Exercise for at least 150 minutes each week. The exercise should increase your heart rate and make you sweat (moderate-intensity exercise). Do strengthening exercises at least twice a week. This is in addition to the moderate-intensity exercise. Spend less time sitting. Even light physical activity can be beneficial. Watch cholesterol and blood lipids Have your blood tested for lipids and cholesterol at 38 years of age, then have this test every 5 years. You may need to have your cholesterol levels checked more often if: Your lipid or cholesterol levels are high. You are older than 38 years of age. You are at high  risk for heart disease. What should I know about cancer screening? Many types of cancers can be detected early and may often be prevented. Depending on your health history and family history, you may need to have cancer screening at various ages. This may include screening for: Colorectal cancer. Prostate cancer. Skin cancer. Lung cancer. What should I know about heart disease, diabetes, and high blood pressure? Blood pressure and heart disease High blood pressure causes heart disease and increases the risk of stroke. This is more likely to develop in people who have high blood pressure readings or are overweight. Talk with your health care provider about your target blood pressure readings. Have your blood pressure checked: Every 3-5 years if you are 10-47 years of age. Every year if you are 19 years old or older. If you are between the ages of 57 and 42 and are a current or former smoker, ask your health care provider if you should have a one-time screening for abdominal aortic aneurysm (AAA). Diabetes Have regular diabetes screenings. This checks your fasting blood sugar level. Have the screening done: Once every three years after age 4 if you are at a normal weight and have a low risk for diabetes. More often and at a younger age if you are overweight or have a high risk for diabetes. What should I know about preventing infection? Hepatitis B If you have a higher risk for hepatitis B, you should be screened for this virus. Talk with your health care provider to find out if you are at risk for hepatitis  B infection. Hepatitis C Blood testing is recommended for: Everyone born from [birth year] through 1963. Anyone with known risk factors for hepatitis C. Sexually transmitted infections (STIs) You should be screened each year for STIs, including gonorrhea and chlamydia, if: You are sexually active and are younger than 38 years of age. You are older than 38 years of age and your health care  provider tells you that you are at risk for this type of infection. Your sexual activity has changed since you were last screened, and you are at increased risk for chlamydia or gonorrhea. Ask your health care provider if you are at risk. Ask your health care provider about whether you are at high risk for HIV. Your health care provider may recommend a prescription medicine to help prevent HIV infection. If you choose to take medicine to prevent HIV, you should first get tested for HIV. You should then be tested every 3 months for as long as you are taking the medicine. Follow these instructions at home: Alcohol use Do not drink alcohol if your health care provider tells you not to drink. If you drink alcohol: Limit how much you have to 0-2 drinks a day. Know how much alcohol is in your drink. In the [place] one drink equals one 12 oz bottle of beer (355 mL), one 5 oz glass of wine (148 mL), or one 1 oz glass of hard liquor (44 mL). Lifestyle Do not use any products that contain nicotine or tobacco. These products include cigarettes, chewing tobacco, and vaping devices, such as e-cigarettes. If you need help quitting, ask your health care provider. Do not use street drugs. Do not share needles. Ask your health care provider for help if you need support or information about quitting drugs. General instructions Schedule regular health, and eye exams. Stay current with your vaccines. Tell your health care provider if: You often feel depressed. You have ever been abused or do not feel safe at home. Summary Adopting a healthy lifestyle and getting preventive care are important in promoting health and wellness. Follow your health care provider's instructions about healthy diet, exercising, and getting tested or screened for diseases. Follow your health care provider's instructions on monitoring your cholesterol and blood pressure. This information is not intended to replace advice given to you by  your health care provider. Make sure you discuss any questions you have with your health care provider. Document Revised: 06/30/2020 Document Reviewed: 06/30/2020 Elsevier Patient Education  2022 [employer].

## 2021-02-02 ENCOUNTER — Ambulatory Visit (INDEPENDENT_AMBULATORY_CARE_PROVIDER_SITE_OTHER): Payer: Managed Care, Other (non HMO) | Admitting: Internal Medicine

## 2021-02-02 ENCOUNTER — Other Ambulatory Visit: Payer: Self-pay

## 2021-02-02 VITALS — BP 120/74 | HR 97 | Temp 98.4°F | Ht 72.0 in | Wt 223.0 lb

## 2021-02-02 DIAGNOSIS — E7849 Other hyperlipidemia: Secondary | ICD-10-CM | POA: Diagnosis not present

## 2021-02-02 DIAGNOSIS — K219 Gastro-esophageal reflux disease without esophagitis: Secondary | ICD-10-CM | POA: Diagnosis not present

## 2021-02-02 DIAGNOSIS — Z0000 Encounter for general adult medical examination without abnormal findings: Secondary | ICD-10-CM | POA: Diagnosis not present

## 2021-02-02 DIAGNOSIS — Z23 Encounter for immunization: Secondary | ICD-10-CM | POA: Diagnosis not present

## 2021-02-02 DIAGNOSIS — L309 Dermatitis, unspecified: Secondary | ICD-10-CM

## 2021-02-02 DIAGNOSIS — J0110 Acute frontal sinusitis, unspecified: Secondary | ICD-10-CM

## 2021-02-02 DIAGNOSIS — M25511 Pain in right shoulder: Secondary | ICD-10-CM | POA: Diagnosis not present

## 2021-02-02 DIAGNOSIS — G8929 Other chronic pain: Secondary | ICD-10-CM

## 2021-02-02 DIAGNOSIS — G44019 Episodic cluster headache, not intractable: Secondary | ICD-10-CM

## 2021-02-02 MED ORDER — DOXYCYCLINE HYCLATE 100 MG PO TABS: 100.0000 mg | ORAL_TABLET | Freq: Two times a day (BID) | ORAL | 0 refills | Status: AC

## 2021-02-02 MED ORDER — HYDROCODONE-ACETAMINOPHEN 7.5-325 MG PO TABS
1.0000 | ORAL_TABLET | Freq: Four times a day (QID) | ORAL | 0 refills | Status: DC | PRN
Start: 2021-02-02 — End: 2021-05-14

## 2021-02-02 NOTE — Assessment & Plan Note (Signed)
Chronic Has intermittent pain flares depending on weather and work Hydrocodone-acetaminophen 7.5-325 mg 1 tablet every 6 hours as needed for increased pain

## 2021-02-02 NOTE — Assessment & Plan Note (Signed)
Chronic Continue clobetasol cream as needed

## 2021-02-02 NOTE — Assessment & Plan Note (Signed)
Chronic, intermittent Has not had any recent cluster headaches Has Imitrex nasal spray and injections at home to use if needed

## 2021-02-02 NOTE — Assessment & Plan Note (Signed)
Chronic GERD controlled Continue famotidine 40 mg twice daily 

## 2021-02-02 NOTE — Assessment & Plan Note (Signed)
Chronic °Regular exercise and healthy diet encouraged °Check lipid panel  °Lifestyle controlled °

## 2021-02-02 NOTE — Assessment & Plan Note (Signed)
Acute Likely bacterial-has recurrent sinus infections Has an appointment to see ENT Start doxycycline 100 mg BID x 14 day otc cold medications Rest, fluid Call if no improvement

## 2021-02-03 NOTE — Addendum Note (Signed)
Addended by: Karma Ganja on: 02/03/2021 09:35 AM   Modules accepted: Orders

## 2021-02-13 ENCOUNTER — Other Ambulatory Visit (INDEPENDENT_AMBULATORY_CARE_PROVIDER_SITE_OTHER): Payer: Managed Care, Other (non HMO)

## 2021-02-13 DIAGNOSIS — Z0000 Encounter for general adult medical examination without abnormal findings: Secondary | ICD-10-CM | POA: Diagnosis not present

## 2021-02-13 DIAGNOSIS — E7849 Other hyperlipidemia: Secondary | ICD-10-CM

## 2021-02-13 LAB — CBC WITH DIFFERENTIAL/PLATELET
Basophils Absolute: 0 10*3/uL (ref 0.0–0.1)
Basophils Relative: 0.9 % (ref 0.0–3.0)
Eosinophils Absolute: 0.1 10*3/uL (ref 0.0–0.7)
Eosinophils Relative: 1.7 % (ref 0.0–5.0)
HCT: 46.7 % (ref 39.0–52.0)
Hemoglobin: 16.1 g/dL (ref 13.0–17.0)
Lymphocytes Relative: 43.5 % (ref 12.0–46.0)
Lymphs Abs: 2.1 10*3/uL (ref 0.7–4.0)
MCHC: 34.4 g/dL (ref 30.0–36.0)
MCV: 88.9 fl (ref 78.0–100.0)
Monocytes Absolute: 0.4 10*3/uL (ref 0.1–1.0)
Monocytes Relative: 7.7 % (ref 3.0–12.0)
Neutro Abs: 2.2 10*3/uL (ref 1.4–7.7)
Neutrophils Relative %: 46.2 % (ref 43.0–77.0)
Platelets: 279 10*3/uL (ref 150.0–400.0)
RBC: 5.26 Mil/uL (ref 4.22–5.81)
RDW: 13.5 % (ref 11.5–15.5)
WBC: 4.8 10*3/uL (ref 4.0–10.5)

## 2021-02-13 LAB — COMPREHENSIVE METABOLIC PANEL
ALT: 43 U/L (ref 0–53)
AST: 25 U/L (ref 0–37)
Albumin: 4.8 g/dL (ref 3.5–5.2)
Alkaline Phosphatase: 38 U/L — ABNORMAL LOW (ref 39–117)
BUN: 7 mg/dL (ref 6–23)
CO2: 25 mEq/L (ref 19–32)
Calcium: 9.3 mg/dL (ref 8.4–10.5)
Chloride: 103 mEq/L (ref 96–112)
Creatinine, Ser: 1.11 mg/dL (ref 0.40–1.50)
GFR: 84.18 mL/min (ref >60.00)
Glucose, Bld: 70 mg/dL (ref 70–99)
Potassium: 3.7 mEq/L (ref 3.5–5.1)
Sodium: 140 mEq/L (ref 135–145)
Total Bilirubin: 0.7 mg/dL (ref 0.2–1.2)
Total Protein: 7.9 g/dL (ref 6.0–8.3)

## 2021-02-13 LAB — LIPID PANEL
Cholesterol: 249 mg/dL — ABNORMAL HIGH (ref 0–200)
HDL: 38.4 mg/dL — ABNORMAL LOW (ref >39.00)
NonHDL: 210.38
Total CHOL/HDL Ratio: 6
Triglycerides: 334 mg/dL — ABNORMAL HIGH (ref 0.0–149.0)
VLDL: 66.8 mg/dL — ABNORMAL HIGH (ref 0.0–40.0)

## 2021-02-13 LAB — TSH: TSH: 0.54 u[IU]/mL (ref 0.35–5.50)

## 2021-02-13 LAB — LDL CHOLESTEROL, DIRECT: Direct LDL: 160 mg/dL

## 2021-03-19 ENCOUNTER — Other Ambulatory Visit: Payer: Self-pay | Admitting: Otolaryngology

## 2021-03-19 DIAGNOSIS — J329 Chronic sinusitis, unspecified: Secondary | ICD-10-CM

## 2021-04-16 ENCOUNTER — Ambulatory Visit
Admission: RE | Admit: 2021-04-16 | Discharge: 2021-04-16 | Disposition: A | Discharge status: Home / Self Care | Payer: Managed Care, Other (non HMO) | Source: Ambulatory Visit | Attending: Otolaryngology | Admitting: Otolaryngology

## 2021-04-16 DIAGNOSIS — J329 Chronic sinusitis, unspecified: Secondary | ICD-10-CM

## 2021-05-13 ENCOUNTER — Encounter: Payer: Self-pay | Admitting: Internal Medicine

## 2021-05-13 NOTE — Progress Notes (Signed)
Virtual Visit via Video Note ? ?I connected with Jamie Powell on 05/13/21 at  7:50 AM EDT by a video enabled telemedicine application and verified that I am speaking with the correct person using two identifiers. ?  ?I discussed the limitations of evaluation and management by telemedicine and the availability of in person appointments. The patient expressed understanding and agreed to proceed. ? ?Present for the visit:  Myself, Dr Cheryll Cockayne, Jamie Powell.  The patient is currently at home and I am in the office.   ? ?No referring provider.  ? ? ?History of Present Illness: ?He is here for an acute visit for cold symptoms. ? ?His symptoms started several days ago.  He has frequent sinus infections and recently saw ENT and needs sinus surgery.  ? ?He is experiencing nasal congestion, ears clogged associated with hearing loss, sinus pain, headaches, headaches and body aches.  ? ? ?He continues to have right shoulder pain.  This is a chronic issue and has had multiple procedures and surgeries.  He has seen orthopedics.  He uses the pain medication as needed only for severe pain and tries to avoid it because he is aware of the risk of addiction.  He would like to get a refill of the pain medication today. ? ? ?Review of Systems  ?Constitutional:  Negative for fever.  ?HENT:  Positive for congestion, hearing loss, nosebleeds and sinus pain. Negative for ear pain (ear stopped up, clogged) and sore throat.   ?     PND  ?Respiratory:  Positive for cough. Negative for shortness of breath and wheezing.   ?Musculoskeletal:  Positive for myalgias.  ?Neurological:  Positive for headaches.   ? ?Social History  ? ?Socioeconomic History  ? Marital status: Married  ?  Spouse name: [name]  ? Number of children: Not on file  ? Years of education: Not on file  ? Highest education level: Not on file  ?Occupational History  ? Not on file  ?Tobacco Use  ? Smoking status: Former  ? Smokeless tobacco: Current  ?Substance and Sexual  Activity  ? Alcohol use: Yes  ?  Comment: occasional  ? Drug use: No  ? Sexual activity: Not on file  ?Other Topics Concern  ? 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 ?Social Determinants of Health  ? ?Financial Resource Strain: Not on file  ?Food Insecurity: Not on file  ?Transportation Needs: Not on file  ?Physical Activity: Not on file  ?Stress: Not on file  ?Social Connections: Not on file  ? ?  ?Observations/Objective: ?Appears well in NAD ?Breathing normally ? ?Assessment and Plan: ? ?See Problem List for Assessment and Plan of chronic medical problems. ? ? ?Follow Up Instructions: ? ?  ?I discussed the assessment and treatment plan with the patient. The patient was provided an opportunity to ask questions and all were answered. The patient agreed with the plan and demonstrated an understanding of the instructions. ?  ?The patient was advised to call back or seek an in-person evaluation if the symptoms worsen or if the condition fails to improve as anticipated. ? ? ? ?Pincus Sanes, MD ? ?

## 2021-05-14 ENCOUNTER — Telehealth (INDEPENDENT_AMBULATORY_CARE_PROVIDER_SITE_OTHER): Payer: Managed Care, Other (non HMO) | Admitting: Internal Medicine

## 2021-05-14 DIAGNOSIS — M25511 Pain in right shoulder: Secondary | ICD-10-CM

## 2021-05-14 DIAGNOSIS — G8929 Other chronic pain: Secondary | ICD-10-CM

## 2021-05-14 DIAGNOSIS — J0110 Acute frontal sinusitis, unspecified: Secondary | ICD-10-CM

## 2021-05-14 MED ORDER — HYDROCODONE-ACETAMINOPHEN 7.5-325 MG PO TABS: 1.0000 | ORAL_TABLET | Freq: Four times a day (QID) | ORAL | 0 refills | Status: DC | PRN

## 2021-05-14 MED ORDER — DOXYCYCLINE HYCLATE 100 MG PO TABS: 100.0000 mg | ORAL_TABLET | Freq: Two times a day (BID) | ORAL | 0 refills | Status: AC

## 2021-05-14 NOTE — Assessment & Plan Note (Signed)
Acute ?Likely bacterial  ?Start Doxycyline 100 mg BID x 10 day ?otc cold medications ?Rest, fluid ?Call if no improvement ? ?

## 2021-05-14 NOTE — Assessment & Plan Note (Signed)
Chronic ?Daily pain ?Uses hydrocodone-acetaminophen 7.5-325 mg as needed for severe pain-he does limit this to when he only has very severe pain ?Refill medication today-[place] controlled substance database checked and he is taking medication appropriately and infrequently which she will continue to do ?

## 2021-06-11 ENCOUNTER — Other Ambulatory Visit (HOSPITAL_COMMUNITY): Payer: Self-pay

## 2021-06-11 ENCOUNTER — Other Ambulatory Visit: Payer: Self-pay | Admitting: Internal Medicine

## 2021-06-11 MED ORDER — SUMATRIPTAN SUCCINATE 6 MG/0.5ML ~~LOC~~ SOLN
SUBCUTANEOUS | 5 refills | Status: DC
Start: 2021-06-11 — End: 2022-02-03
  Filled 2021-06-11: qty 4, 30d supply, fill #0
  Filled 2021-12-10: qty 4, 30d supply, fill #1
  Filled 2021-12-11: qty 2.5, 15d supply, fill #1
  Filled 2021-12-11: qty 1.5, 15d supply, fill #1

## 2021-06-12 ENCOUNTER — Other Ambulatory Visit (HOSPITAL_COMMUNITY): Payer: Self-pay

## 2021-06-15 ENCOUNTER — Other Ambulatory Visit (HOSPITAL_COMMUNITY): Payer: Self-pay

## 2021-06-16 ENCOUNTER — Other Ambulatory Visit (HOSPITAL_COMMUNITY): Payer: Self-pay

## 2021-06-19 ENCOUNTER — Other Ambulatory Visit (HOSPITAL_COMMUNITY): Payer: Self-pay

## 2021-07-24 NOTE — Progress Notes (Signed)
Subjective:    Patient ID: Jamie Powell, male    DOB: October 07, 1980, 41 y.o.   MRN: [id number]     HPI Anson is here for follow up of his chronic medical problems, including chronic right shoulder pain, GERD.  He has pain daily in his right shoulder.  He is unsure what to do about the pain and he needs to see pain management.  He has seen orthopedics and is not much more that they can do for him.  The pain medication works and he only takes it as needed.  He is taking the famotidine twice daily for his reflux.  His lungs he takes both pills his reflux is fairly controlled.  On occasion he does forget and then he will be symptomatic.  He does have a high co-pay for the medication and was wondering about alternatives.  Medications and allergies reviewed with patient and updated if appropriate.  Current Outpatient Medications on File Prior to Visit  Medication Sig Dispense Refill   clobetasol cream (TEMOVATE) 0.05 % Apply 1 application topically 2 (two) times daily. 45 g 2   famotidine (PEPCID) 40 MG tablet Take 1 tablet (40 mg total) by mouth 2 (two) times daily. 180 tablet 3   HYDROcodone-acetaminophen (NORCO) 7.5-325 MG tablet Take 1 tablet by mouth every 6 (six) hours as needed for moderate pain. 30 tablet 0   SUMAtriptan (IMITREX) 6 MG/0.5ML SOLN injection INJECT 0.5 MLS INTO THE SKIN ONCE AS NEEDED FOR UP TO 1 DOSE FOR MIGRAINE OR HEADACHE. MAY REPEAT IN 1 HOUR IF HEADACHE PERSISTS OR RECURS 4 mL 5   triamcinolone ointment (KENALOG) 0.5 % Apply 1 application topically 2 (two) times daily. 30 g 3   SUMAtriptan (IMITREX) 20 MG/ACT nasal spray PLACE 1 SPRAY INTO THE NOSE AS NEEDED FOR MIGRAINE OR HEADACHE. MAY REPEAT ONCE IN 2 HOURS IF HEADACHE PERSISTS OR RECURS. MAX OF 2 SPRAYS PER DAY 10 each 1   No current facility-administered medications on file prior to visit.     Review of Systems  Constitutional:  Negative for fever.  Gastrointestinal:  Negative for abdominal pain and  nausea.  Musculoskeletal:  Positive for arthralgias.      Objective:   Vitals:   07/27/21 1531  BP: 118/74  Pulse: 78  Temp: 98 F (36.7 C)  SpO2: 95%   BP Readings from Last 3 Encounters:  07/27/21 118/74  02/02/21 120/74  08/20/20 118/78   Wt Readings from Last 3 Encounters:  07/27/21 224 lb (101.6 kg)  02/02/21 223 lb (101.2 kg)  08/20/20 222 lb (100.7 kg)   Body mass index is 30.38 kg/m.    Physical Exam Constitutional:      General: He is not in acute distress.    Appearance: Normal appearance.  HENT:     Head: Normocephalic.  Skin:    General: Skin is warm and dry.  Neurological:     Mental Status: He is alert. Mental status is at baseline.  Psychiatric:        Mood and Affect: Mood normal.       Lab Results  Component Value Date   WBC 4.8 02/13/2021   HGB 16.1 02/13/2021   HCT 46.7 02/13/2021   PLT 279.0 02/13/2021   GLUCOSE 70 02/13/2021   CHOL 249 (H) 02/13/2021   TRIG 334.0 (H) 02/13/2021   HDL 38.40 (L) 02/13/2021   LDLDIRECT 160.0 02/13/2021   ALT 43 02/13/2021   AST 25 02/13/2021  NA 140 02/13/2021   K 3.7 02/13/2021   CL 103 02/13/2021   CREATININE 1.11 02/13/2021   BUN 7 02/13/2021   CO2 25 02/13/2021   TSH 0.54 02/13/2021     Assessment & Plan:    See Problem List for Assessment and Plan of chronic medical problems.

## 2021-07-27 ENCOUNTER — Ambulatory Visit: Payer: Managed Care, Other (non HMO) | Admitting: Internal Medicine

## 2021-07-27 ENCOUNTER — Encounter: Payer: Self-pay | Admitting: Internal Medicine

## 2021-07-27 VITALS — BP 118/74 | HR 78 | Temp 98.0°F | Ht 72.0 in | Wt 224.0 lb

## 2021-07-27 DIAGNOSIS — K219 Gastro-esophageal reflux disease without esophagitis: Secondary | ICD-10-CM | POA: Diagnosis not present

## 2021-07-27 DIAGNOSIS — G8929 Other chronic pain: Secondary | ICD-10-CM | POA: Diagnosis not present

## 2021-07-27 DIAGNOSIS — M25511 Pain in right shoulder: Secondary | ICD-10-CM | POA: Diagnosis not present

## 2021-07-27 MED ORDER — CLOBETASOL PROPIONATE 0.05 % EX CREA: 1.0000 "application " | TOPICAL_CREAM | Freq: Two times a day (BID) | CUTANEOUS | 2 refills | Status: DC

## 2021-07-27 MED ORDER — OMEPRAZOLE 40 MG PO CPDR: 40.0000 mg | DELAYED_RELEASE_CAPSULE | Freq: Every day | ORAL | 3 refills | Status: DC

## 2021-07-27 MED ORDER — HYDROCODONE-ACETAMINOPHEN 7.5-325 MG PO TABS: 1.0000 | ORAL_TABLET | Freq: Four times a day (QID) | ORAL | 0 refills | Status: DC | PRN

## 2021-07-27 NOTE — Assessment & Plan Note (Signed)
Chronic Has daily pain Has had multiple laparoscopies surgeries Currently taking hydrocodone 7.5-325 mg daily as needed, but tries to keep it to a minimum He takes it for severe pain He does not abuse the medication and denies any side effects We discussed options with his chronic pain that unfortunately will be chronic Going back to see orthopedics does not make sense At this point going to see pain management is not necessary We will continue hydrocodone-acetaminophen 7.5-325 mg every 6 hours as needed for severe pain only-he often just takes this once a day and does not take it on a regular basis and will continue to do so [place] controlled substance database checked.  Refill sent to pharmacy

## 2021-07-27 NOTE — Patient Instructions (Addendum)
      Medications changes include :   stop famotidine.  Start omeprazole 40 mg daily.    Your prescription(s) have been sent to your pharmacy.      Return in about 6 months (around 01/26/2022) for Physical Exam.  Last CPE 02/02/22

## 2021-07-27 NOTE — Assessment & Plan Note (Addendum)
Chronic Taking pepcid 40mg  bid  fairly controlled as long as he takes both pills every day Would do better with 1 pill a day Will switch to omeprazole 40 mg daily-this has confirmed in the past, but he would like to retry it if not effective after 4 months he will let me know we will consider different medication

## 2021-07-28 ENCOUNTER — Encounter: Payer: Self-pay | Admitting: Internal Medicine

## 2021-07-29 MED ORDER — HYDROCODONE-ACETAMINOPHEN 7.5-325 MG PO TABS: 1.0000 | ORAL_TABLET | Freq: Four times a day (QID) | ORAL | 0 refills | Status: DC | PRN

## 2021-12-11 ENCOUNTER — Other Ambulatory Visit (HOSPITAL_COMMUNITY): Payer: Self-pay

## 2021-12-14 ENCOUNTER — Other Ambulatory Visit (HOSPITAL_COMMUNITY): Payer: Self-pay

## 2022-01-26 ENCOUNTER — Ambulatory Visit: Payer: Managed Care, Other (non HMO) | Admitting: Internal Medicine

## 2022-02-01 ENCOUNTER — Ambulatory Visit: Payer: Managed Care, Other (non HMO) | Admitting: Internal Medicine

## 2022-02-02 ENCOUNTER — Encounter: Payer: Self-pay | Admitting: Internal Medicine

## 2022-02-02 NOTE — Patient Instructions (Signed)
Flu immunization administered today.       Medications changes include :  omeprazole 40 mg twice daily     A referral was ordered for pain management.     Someone will call you to schedule an appointment.     Return in about 6 months (around 08/05/2022) for follow up.   Health Maintenance, Male Adopting a healthy lifestyle and getting preventive care are important in promoting health and wellness. Ask your health care provider about: The right schedule for you to have regular tests and exams. Things you can do on your own to prevent diseases and keep yourself healthy. What should I know about diet, weight, and exercise? Eat a healthy diet  Eat a diet that includes plenty of vegetables, fruits, low-fat dairy products, and lean protein. Do not eat a lot of foods that are high in solid fats, added sugars, or sodium. Maintain a healthy weight Body mass index (BMI) is a measurement that can be used to identify possible weight problems. It estimates body fat based on height and weight. Your health care provider can help determine your BMI and help you achieve or maintain a healthy weight. Get regular exercise Get regular exercise. This is one of the most important things you can do for your health. Most adults should: Exercise for at least 150 minutes each week. The exercise should increase your heart rate and make you sweat (moderate-intensity exercise). Do strengthening exercises at least twice a week. This is in addition to the moderate-intensity exercise. Spend less time sitting. Even light physical activity can be beneficial. Watch cholesterol and blood lipids Have your blood tested for lipids and cholesterol at 39 years of age, then have this test every 5 years. You may need to have your cholesterol levels checked more often if: Your lipid or cholesterol levels are high. You are older than 39 years of age. You are at high risk for heart disease. What should I know about  cancer screening? Many types of cancers can be detected early and may often be prevented. Depending on your health history and family history, you may need to have cancer screening at various ages. This may include screening for: Colorectal cancer. Prostate cancer. Skin cancer. Lung cancer. What should I know about heart disease, diabetes, and high blood pressure? Blood pressure and heart disease High blood pressure causes heart disease and increases the risk of stroke. This is more likely to develop in people who have high blood pressure readings or are overweight. Talk with your health care provider about your target blood pressure readings. Have your blood pressure checked: Every 3-5 years if you are 24-28 years of age. Every year if you are 62 years old or older. If you are between the ages of 1 and 73 and are a current or former smoker, ask your health care provider if you should have a one-time screening for abdominal aortic aneurysm (AAA). Diabetes Have regular diabetes screenings. This checks your fasting blood sugar level. Have the screening done: Once every three years after age 3 if you are at a normal weight and have a low risk for diabetes. More often and at a younger age if you are overweight or have a high risk for diabetes. What should I know about preventing infection? Hepatitis B If you have a higher risk for hepatitis B, you should be screened for this virus. Talk with your health care provider to find out if you are at risk for hepatitis  B infection. Hepatitis C Blood testing is recommended for: Everyone born from [birth year] through 1963. Anyone with known risk factors for hepatitis C. Sexually transmitted infections (STIs) You should be screened each year for STIs, including gonorrhea and chlamydia, if: You are sexually active and are younger than 39 years of age. You are older than 39 years of age and your health care provider tells you that you are at risk for this type  of infection. Your sexual activity has changed since you were last screened, and you are at increased risk for chlamydia or gonorrhea. Ask your health care provider if you are at risk. Ask your health care provider about whether you are at high risk for HIV. Your health care provider may recommend a prescription medicine to help prevent HIV infection. If you choose to take medicine to prevent HIV, you should first get tested for HIV. You should then be tested every 3 months for as long as you are taking the medicine. Follow these instructions at home: Alcohol use Do not drink alcohol if your health care provider tells you not to drink. If you drink alcohol: Limit how much you have to 0-2 drinks a day. Know how much alcohol is in your drink. In the [place] one drink equals one 12 oz bottle of beer (355 mL), one 5 oz glass of wine (148 mL), or one 1 oz glass of hard liquor (44 mL). Lifestyle Do not use any products that contain nicotine or tobacco. These products include cigarettes, chewing tobacco, and vaping devices, such as e-cigarettes. If you need help quitting, ask your health care provider. Do not use street drugs. Do not share needles. Ask your health care provider for help if you need support or information about quitting drugs. General instructions Schedule regular health, dental, and eye exams. Stay current with your vaccines. Tell your health care provider if: You often feel depressed. You have ever been abused or do not feel safe at home. Summary Adopting a healthy lifestyle and getting preventive care are important in promoting health and wellness. Follow your health care provider's instructions about healthy diet, exercising, and getting tested or screened for diseases. Follow your health care provider's instructions on monitoring your cholesterol and blood pressure. This information is not intended to replace advice given to you by your health care provider. Make sure you discuss  any questions you have with your health care provider. Document Revised: 06/30/2020 Document Reviewed: 06/30/2020 Elsevier Patient Education  2023 [employer].

## 2022-02-02 NOTE — Progress Notes (Unsigned)
      Subjective:    Patient ID: Jamie Powell, male    DOB: 1980/10/05, 41 y.o.   MRN: [id number]     HPI Jamie Powell is here for follow up of his chronic medical problems, including chronic right shoulder pain, cluster headaches, GERD    Medications and allergies reviewed with patient and updated if appropriate.  Current Outpatient Medications on File Prior to Visit  Medication Sig Dispense Refill   clobetasol cream (TEMOVATE) 0.05 % Apply 1 application. topically 2 (two) times daily. 45 g 2   HYDROcodone-acetaminophen (NORCO) 7.5-325 MG tablet Take 1 tablet by mouth every 6 (six) hours as needed for severe pain (chronic shoulder pain). 60 tablet 0   omeprazole (PRILOSEC) 40 MG capsule Take 1 capsule (40 mg total) by mouth daily. Take 30 minutes prior to a meal 90 capsule 3   SUMAtriptan (IMITREX) 20 MG/ACT nasal spray PLACE 1 SPRAY INTO THE NOSE AS NEEDED FOR MIGRAINE OR HEADACHE. MAY REPEAT ONCE IN 2 HOURS IF HEADACHE PERSISTS OR RECURS. MAX OF 2 SPRAYS PER DAY 10 each 1   SUMAtriptan (IMITREX) 6 MG/0.5ML SOLN injection INJECT 0.5 MLS INTO THE SKIN ONCE AS NEEDED FOR UP TO 1 DOSE FOR MIGRAINE OR HEADACHE. MAY REPEAT IN 1 HOUR IF HEADACHE PERSISTS OR RECURS 4 mL 5   triamcinolone ointment (KENALOG) 0.5 % Apply 1 application topically 2 (two) times daily. 30 g 3   No current facility-administered medications on file prior to visit.     Review of Systems     Objective:  There were no vitals filed for this visit. BP Readings from Last 3 Encounters:  07/27/21 118/74  02/02/21 120/74  08/20/20 118/78   Wt Readings from Last 3 Encounters:  07/27/21 224 lb (101.6 kg)  02/02/21 223 lb (101.2 kg)  08/20/20 222 lb (100.7 kg)   There is no height or weight on file to calculate BMI.    Physical Exam     Lab Results  Component Value Date   WBC 4.8 02/13/2021   HGB 16.1 02/13/2021   HCT 46.7 02/13/2021   PLT 279.0 02/13/2021   GLUCOSE 70 02/13/2021   CHOL 249 (H) 02/13/2021    TRIG 334.0 (H) 02/13/2021   HDL 38.40 (L) 02/13/2021   LDLDIRECT 160.0 02/13/2021   ALT 43 02/13/2021   AST 25 02/13/2021   NA 140 02/13/2021   K 3.7 02/13/2021   CL 103 02/13/2021   CREATININE 1.11 02/13/2021   BUN 7 02/13/2021   CO2 25 02/13/2021   TSH 0.54 02/13/2021     Assessment & Plan:    See Problem List for Assessment and Plan of chronic medical problems.

## 2022-02-03 ENCOUNTER — Ambulatory Visit: Payer: Managed Care, Other (non HMO) | Admitting: Internal Medicine

## 2022-02-03 ENCOUNTER — Other Ambulatory Visit: Payer: Self-pay

## 2022-02-03 ENCOUNTER — Ambulatory Visit (INDEPENDENT_AMBULATORY_CARE_PROVIDER_SITE_OTHER): Payer: Managed Care, Other (non HMO) | Admitting: Internal Medicine

## 2022-02-03 VITALS — BP 120/78 | HR 72 | Temp 98.2°F | Ht 72.0 in | Wt 238.0 lb

## 2022-02-03 DIAGNOSIS — K219 Gastro-esophageal reflux disease without esophagitis: Secondary | ICD-10-CM | POA: Diagnosis not present

## 2022-02-03 DIAGNOSIS — M25511 Pain in right shoulder: Secondary | ICD-10-CM

## 2022-02-03 DIAGNOSIS — E7849 Other hyperlipidemia: Secondary | ICD-10-CM

## 2022-02-03 DIAGNOSIS — Z23 Encounter for immunization: Secondary | ICD-10-CM

## 2022-02-03 DIAGNOSIS — Z0000 Encounter for general adult medical examination without abnormal findings: Secondary | ICD-10-CM

## 2022-02-03 DIAGNOSIS — Z1159 Encounter for screening for other viral diseases: Secondary | ICD-10-CM | POA: Diagnosis not present

## 2022-02-03 DIAGNOSIS — G8929 Other chronic pain: Secondary | ICD-10-CM

## 2022-02-03 DIAGNOSIS — G44019 Episodic cluster headache, not intractable: Secondary | ICD-10-CM | POA: Diagnosis not present

## 2022-02-03 MED ORDER — SUMATRIPTAN SUCCINATE 6 MG/0.5ML ~~LOC~~ SOLN: SUBCUTANEOUS | 5 refills | Status: AC

## 2022-02-03 MED ORDER — OMEPRAZOLE 40 MG PO CPDR
40.0000 mg | DELAYED_RELEASE_CAPSULE | Freq: Two times a day (BID) | ORAL | 3 refills | Status: DC
  Filled 2022-02-03: qty 180, 90d supply, fill #0

## 2022-02-03 MED ORDER — OMEPRAZOLE 40 MG PO CPDR: 40.0000 mg | DELAYED_RELEASE_CAPSULE | Freq: Two times a day (BID) | ORAL | 3 refills | Status: AC

## 2022-02-03 MED ORDER — OMEPRAZOLE 40 MG PO CPDR: 40.0000 mg | DELAYED_RELEASE_CAPSULE | Freq: Two times a day (BID) | ORAL | 3 refills | Status: DC

## 2022-02-03 MED ORDER — HYDROCODONE-ACETAMINOPHEN 7.5-325 MG PO TABS: 1.0000 | ORAL_TABLET | Freq: Four times a day (QID) | ORAL | 0 refills | Status: DC | PRN

## 2022-02-03 NOTE — Assessment & Plan Note (Addendum)
Chronic Increased pain x 4 months Will renew norco 7.5 - 325 mg  1 tab Q 6 hrs prn  Referred to pain management

## 2022-02-03 NOTE — Assessment & Plan Note (Signed)
Chronic Check lipid panel  Continue lifestyle control Regular exercise and healthy diet encouraged  

## 2022-02-03 NOTE — Assessment & Plan Note (Signed)
Chronic Not controlled Taking otc omeprazole 60 mg daily Start omeprazole 40 mg bid - will need to goodrx

## 2022-02-03 NOTE — Assessment & Plan Note (Signed)
Chronic Occ migraines No recent cluster headaches Imitrex nasal spray or injections as needed

## 2022-02-04 ENCOUNTER — Other Ambulatory Visit: Payer: Self-pay

## 2022-02-04 ENCOUNTER — Telehealth: Payer: Self-pay

## 2022-02-04 NOTE — Telephone Encounter (Signed)
PA for Hydrocodone has been approved.

## 2022-02-12 ENCOUNTER — Other Ambulatory Visit (INDEPENDENT_AMBULATORY_CARE_PROVIDER_SITE_OTHER): Payer: Managed Care, Other (non HMO)

## 2022-02-12 DIAGNOSIS — Z0000 Encounter for general adult medical examination without abnormal findings: Secondary | ICD-10-CM | POA: Diagnosis not present

## 2022-02-12 DIAGNOSIS — Z1159 Encounter for screening for other viral diseases: Secondary | ICD-10-CM

## 2022-02-12 DIAGNOSIS — E7849 Other hyperlipidemia: Secondary | ICD-10-CM

## 2022-02-12 LAB — LIPID PANEL
Cholesterol: 231 mg/dL — ABNORMAL HIGH (ref 0–200)
HDL: 38.8 mg/dL — ABNORMAL LOW (ref >39.00)
LDL Cholesterol: 165 mg/dL — ABNORMAL HIGH (ref 0–99)
NonHDL: 192.32
Total CHOL/HDL Ratio: 6
Triglycerides: 139 mg/dL (ref 0.0–149.0)
VLDL: 27.8 mg/dL (ref 0.0–40.0)

## 2022-02-12 LAB — CBC WITH DIFFERENTIAL/PLATELET
Basophils Absolute: 0 10*3/uL (ref 0.0–0.1)
Basophils Relative: 0.4 % (ref 0.0–3.0)
Eosinophils Absolute: 0.1 10*3/uL (ref 0.0–0.7)
Eosinophils Relative: 1.1 % (ref 0.0–5.0)
HCT: 42.8 % (ref 39.0–52.0)
Hemoglobin: 14.7 g/dL (ref 13.0–17.0)
Lymphocytes Relative: 27.6 % (ref 12.0–46.0)
Lymphs Abs: 1.7 10*3/uL (ref 0.7–4.0)
MCHC: 34.3 g/dL (ref 30.0–36.0)
MCV: 87.8 fl (ref 78.0–100.0)
Monocytes Absolute: 0.4 10*3/uL (ref 0.1–1.0)
Monocytes Relative: 6.5 % (ref 3.0–12.0)
Neutro Abs: 4 10*3/uL (ref 1.4–7.7)
Neutrophils Relative %: 64.4 % (ref 43.0–77.0)
Platelets: 268 10*3/uL (ref 150.0–400.0)
RBC: 4.88 Mil/uL (ref 4.22–5.81)
RDW: 12.9 % (ref 11.5–15.5)
WBC: 6.1 10*3/uL (ref 4.0–10.5)

## 2022-02-12 LAB — COMPREHENSIVE METABOLIC PANEL
ALT: 41 U/L (ref 0–53)
AST: 18 U/L (ref 0–37)
Albumin: 4.6 g/dL (ref 3.5–5.2)
Alkaline Phosphatase: 38 U/L — ABNORMAL LOW (ref 39–117)
BUN: 11 mg/dL (ref 6–23)
CO2: 28 mEq/L (ref 19–32)
Calcium: 9.7 mg/dL (ref 8.4–10.5)
Chloride: 103 mEq/L (ref 96–112)
Creatinine, Ser: 0.95 mg/dL (ref 0.40–1.50)
GFR: 100.76 mL/min (ref >60.00)
Glucose, Bld: 95 mg/dL (ref 70–99)
Potassium: 4.2 mEq/L (ref 3.5–5.1)
Sodium: 139 mEq/L (ref 135–145)
Total Bilirubin: 0.9 mg/dL (ref 0.2–1.2)
Total Protein: 7.3 g/dL (ref 6.0–8.3)

## 2022-02-13 LAB — TSH: TSH: 0.72 u[IU]/mL (ref 0.35–5.50)

## 2022-02-16 LAB — HEPATITIS C ANTIBODY: Hepatitis C Ab: NONREACTIVE

## 2022-03-26 ENCOUNTER — Encounter
Payer: Managed Care, Other (non HMO) | Attending: Physical Medicine & Rehabilitation | Admitting: Physical Medicine & Rehabilitation

## 2022-03-26 ENCOUNTER — Encounter: Payer: Self-pay | Admitting: Physical Medicine & Rehabilitation

## 2022-03-26 VITALS — BP 117/81 | HR 86 | Ht 72.0 in | Wt 233.0 lb

## 2022-03-26 DIAGNOSIS — Z5181 Encounter for therapeutic drug level monitoring: Secondary | ICD-10-CM | POA: Insufficient documentation

## 2022-03-26 DIAGNOSIS — G8929 Other chronic pain: Secondary | ICD-10-CM | POA: Diagnosis present

## 2022-03-26 DIAGNOSIS — Z79891 Long term (current) use of opiate analgesic: Secondary | ICD-10-CM | POA: Diagnosis present

## 2022-03-26 DIAGNOSIS — M25511 Pain in right shoulder: Secondary | ICD-10-CM | POA: Insufficient documentation

## 2022-03-26 DIAGNOSIS — G894 Chronic pain syndrome: Secondary | ICD-10-CM | POA: Insufficient documentation

## 2022-03-26 MED ORDER — DICLOFENAC SODIUM 1 % EX GEL: 2.0000 g | Freq: Four times a day (QID) | CUTANEOUS | 2 refills | Status: DC
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
## 2022-03-31 LAB — TOXASSURE SELECT,+ANTIDEPR,UR

## 2022-04-06 MED ORDER — HYDROCODONE-ACETAMINOPHEN 7.5-325 MG PO TABS: 1.0000 | ORAL_TABLET | Freq: Two times a day (BID) | ORAL | 0 refills | Status: DC | PRN

## 2022-04-06 NOTE — Addendum Note (Signed)
Addended by: Jennye Boroughs on: 04/06/2022 05:13 PM   Modules accepted: Orders

## 2022-05-27 ENCOUNTER — Encounter
Payer: Managed Care, Other (non HMO) | Attending: Physical Medicine & Rehabilitation | Admitting: Physical Medicine & Rehabilitation

## 2022-05-27 ENCOUNTER — Encounter: Payer: Self-pay | Admitting: Physical Medicine & Rehabilitation

## 2022-05-27 VITALS — BP 135/91 | HR 91 | Ht 72.0 in | Wt 235.6 lb

## 2022-05-27 DIAGNOSIS — Z79891 Long term (current) use of opiate analgesic: Secondary | ICD-10-CM

## 2022-05-27 DIAGNOSIS — G894 Chronic pain syndrome: Secondary | ICD-10-CM

## 2022-05-27 DIAGNOSIS — M25511 Pain in right shoulder: Secondary | ICD-10-CM | POA: Diagnosis present

## 2022-05-27 DIAGNOSIS — G8929 Other chronic pain: Secondary | ICD-10-CM | POA: Insufficient documentation

## 2022-05-27 MED ORDER — HYDROCODONE-ACETAMINOPHEN 7.5-325 MG PO TABS: 1.0000 | ORAL_TABLET | Freq: Two times a day (BID) | ORAL | 0 refills | Status: DC | PRN
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
## 2022-05-29 ENCOUNTER — Encounter: Payer: Self-pay | Admitting: Physical Medicine & Rehabilitation

## 2022-06-28 IMAGING — CT CT MAXILLOFACIAL W/O CM
1 series · 15 of 30 positions shown, 19 images · non-contrast
Comparison: None.

CLINICAL DATA: Chronic sinusitis with heavy drainage. 5-6 sinus
infections every year

EXAM:
CT MAXILLOFACIAL WITHOUT CONTRAST
TECHNIQUE: Multidetector CT images of the paranasal sinuses were obtained using
the standard protocol without intravenous contrast.
RADIATION DOSE REDUCTION: This exam was performed according to the
departmental dose-optimization program which includes automated
exposure control, adjustment of the mA and/or kV according to
patient size and/or use of iterative reconstruction technique.

[Series 4: soft tissue · axial · 0.42mm/px · z∈[-168,-26]mm · 15 of 154 slices shown, 19 images]
[im 6/154  brain]
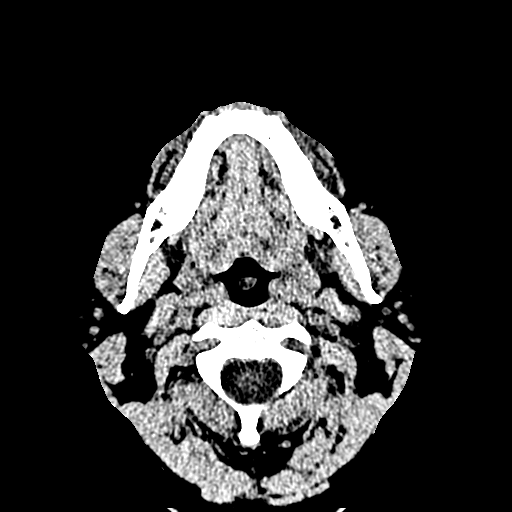
[im 6/154  bone]
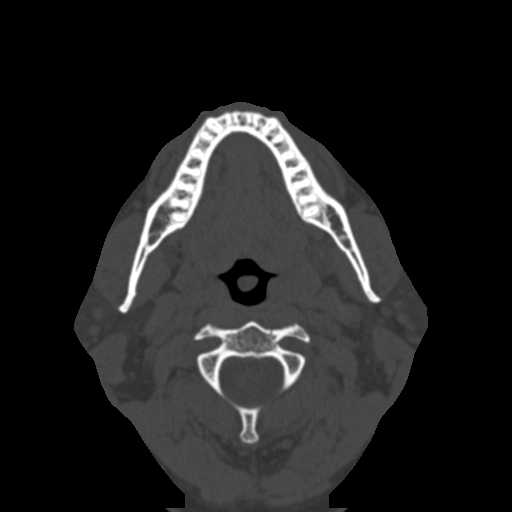
[im 16/154  bone]
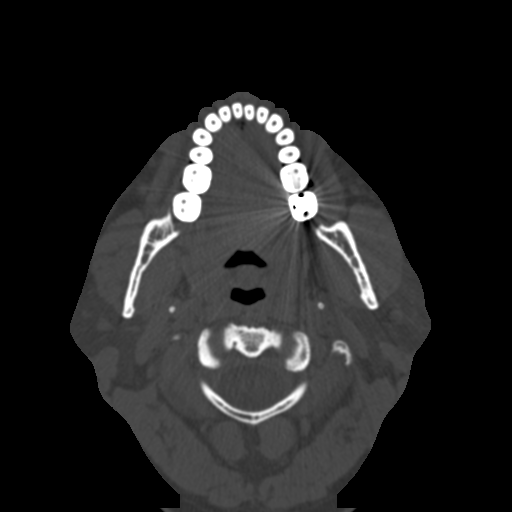
[im 27/154  bone]
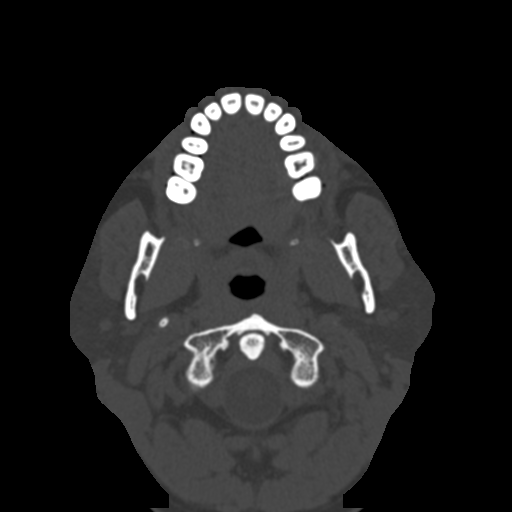
[im 37/154  bone]
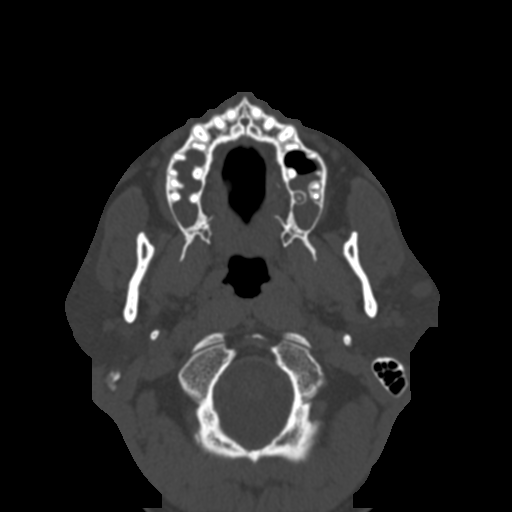
[im 48/154  brain]
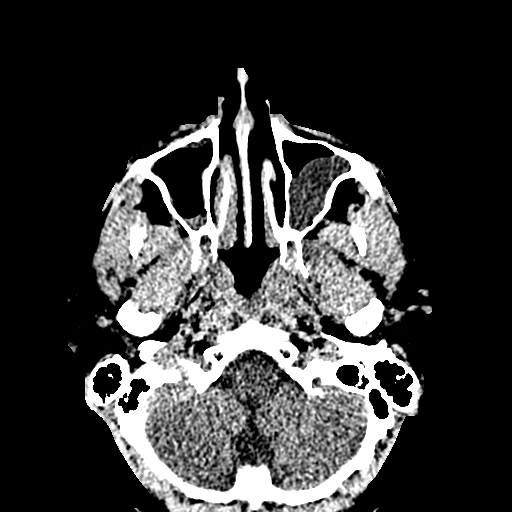
[im 48/154  bone]
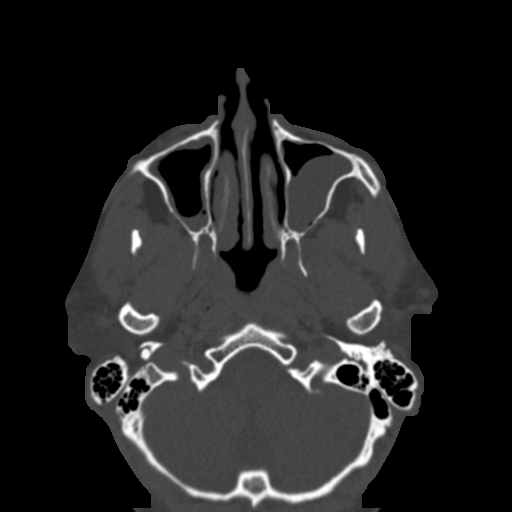
[im 59/154  bone]
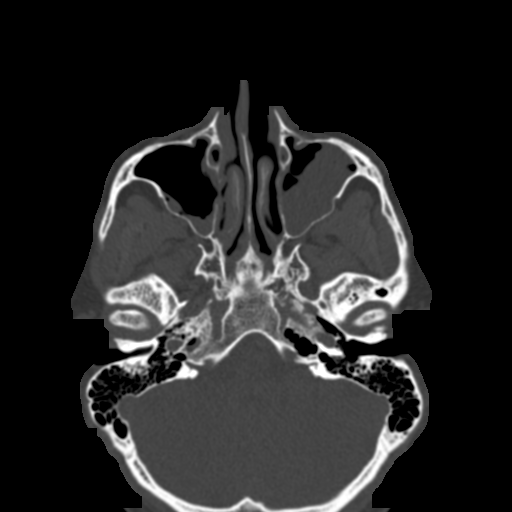
[im 69/154  bone]
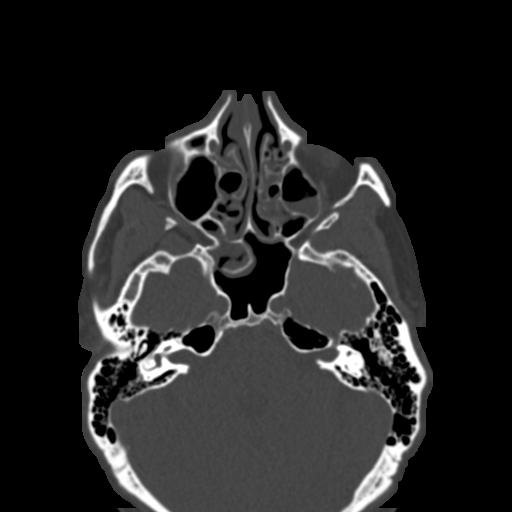
[im 80/154  bone]
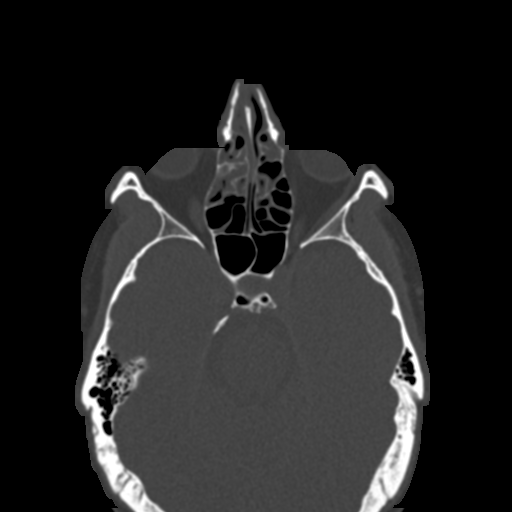
[im 85/154  brain]
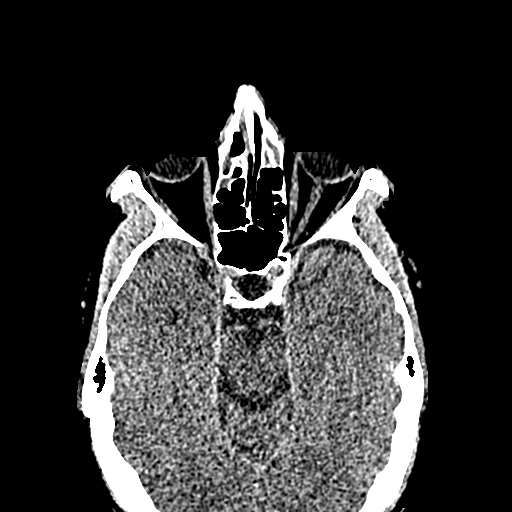
[im 85/154  bone]
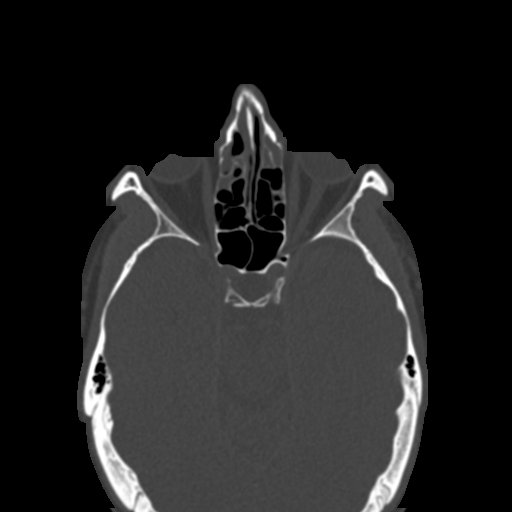
[im 95/154  bone]
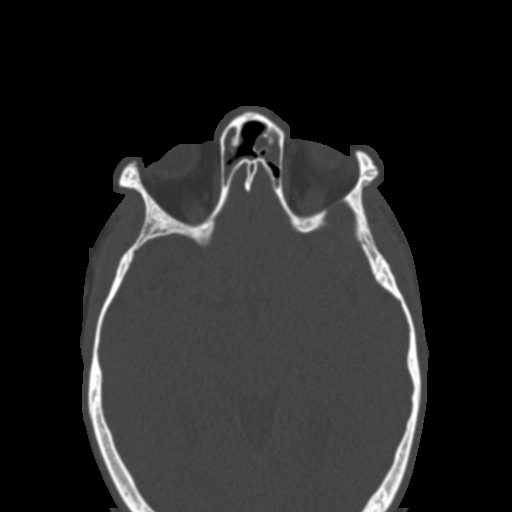
[im 106/154  bone]
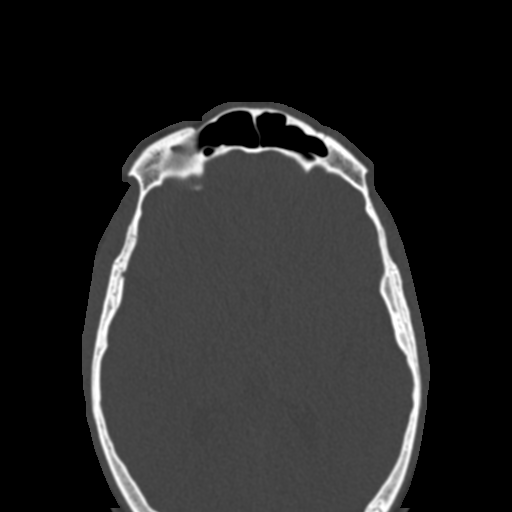
[im 117/154  bone]
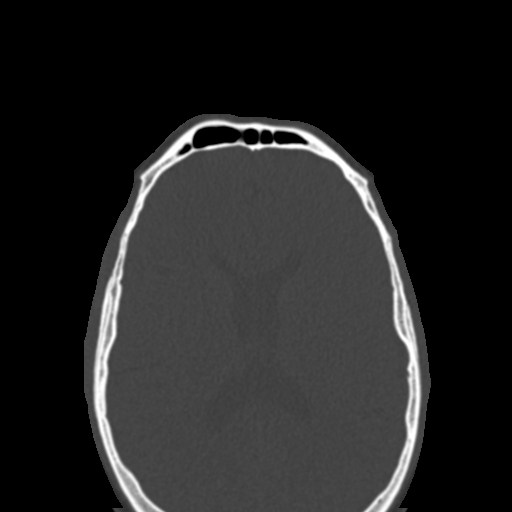
[im 127/154  brain]
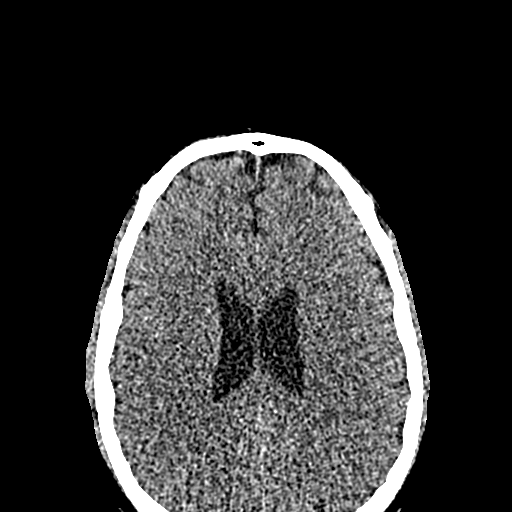
[im 127/154  bone]
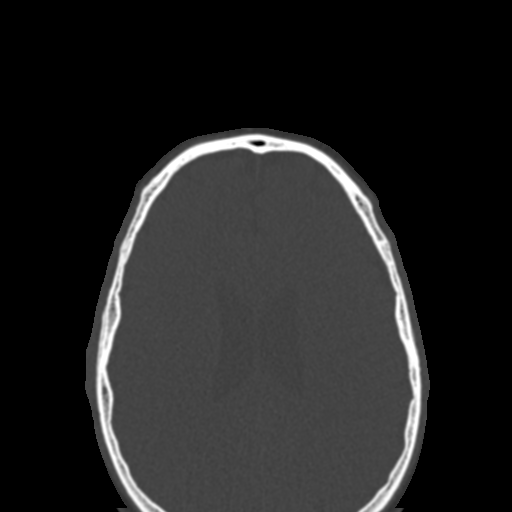
[im 138/154  bone]
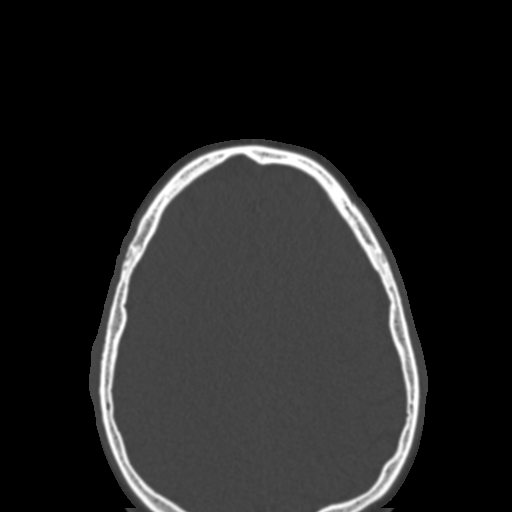
[im 148/154  bone]
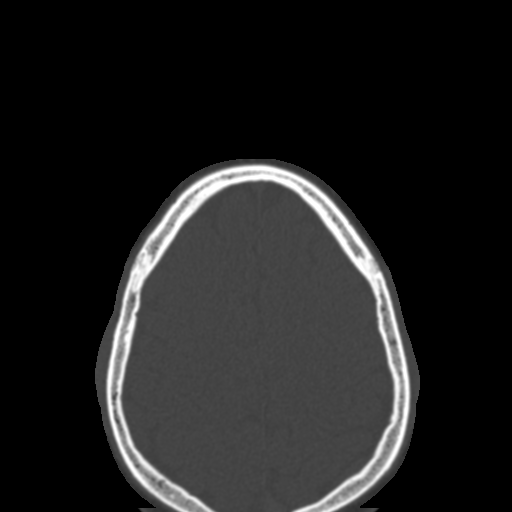

[15 of 30 positions shown; findings below may reference images not displayed]

FINDINGS: Paranasal sinuses:

Frontal: Patchy opacification bilaterally narrowing both fronto
ethmoid recesses.

Ethmoid: Patchy bilateral sinus opacification

Maxillary: Fairly extensive mucosal thickening and secretions
asymmetric to the left

Sphenoid: Mucosal thickening greatest along the lower sinuses where
there is lobulation which may reflect retention cysts. Mucosal
thickening at both ostia with patency only seen on the left. Clear
sphenoid ethmoidal recesses

Right ostiomeatal unit: Large ethmoid bulla narrowing the middle
meatus and infundibulum which is not visibly patent

Left ostiomeatal unit: Prominent ethmoid bulla, although less so
than the right. No visible infundibulum but there is a patent
secondary ostium.

Nasal passages: Generalized mucosal thickening. The nasal septum is
intact with rightward deviation anteriorly.

Anatomy: No pneumatization superior to anterior ethmoid notches.
Sellar sphenoid pneumatization pattern. No dehiscence of carotid or
optic canals. No onodi cell.

Other: No incidental finding on soft tissue windows.
IMPRESSION: 1. Generalized active sinusitis with mucosal thickening narrowing
sinus outflow.
2. Notably large ethmoid bulla on the right.
3. Nonvisualized left maxillary infundibulum with patent secondary
ostium.

## 2022-07-22 ENCOUNTER — Encounter: Payer: Self-pay | Admitting: Physical Medicine & Rehabilitation

## 2022-07-22 ENCOUNTER — Other Ambulatory Visit (HOSPITAL_COMMUNITY): Payer: Self-pay

## 2022-07-22 ENCOUNTER — Encounter
Payer: Managed Care, Other (non HMO) | Attending: Physical Medicine & Rehabilitation | Admitting: Physical Medicine & Rehabilitation

## 2022-07-22 VITALS — BP 125/80 | HR 77 | Ht 72.0 in | Wt 234.0 lb

## 2022-07-22 DIAGNOSIS — M25511 Pain in right shoulder: Secondary | ICD-10-CM | POA: Insufficient documentation

## 2022-07-22 DIAGNOSIS — Z79891 Long term (current) use of opiate analgesic: Secondary | ICD-10-CM

## 2022-07-22 DIAGNOSIS — G894 Chronic pain syndrome: Secondary | ICD-10-CM | POA: Diagnosis present

## 2022-07-22 DIAGNOSIS — G8929 Other chronic pain: Secondary | ICD-10-CM | POA: Diagnosis present

## 2022-07-22 MED ORDER — HYDROCODONE-ACETAMINOPHEN 7.5-325 MG PO TABS
1.0000 | ORAL_TABLET | Freq: Two times a day (BID) | ORAL | 0 refills | Status: DC | PRN
  Filled 2022-07-22: qty 60, 30d supply, fill #0
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
## 2022-07-23 ENCOUNTER — Telehealth: Payer: Self-pay | Admitting: *Deleted

## 2022-07-23 ENCOUNTER — Other Ambulatory Visit: Payer: Self-pay

## 2022-07-23 ENCOUNTER — Other Ambulatory Visit (HOSPITAL_COMMUNITY): Payer: Self-pay

## 2022-07-23 MED ORDER — HYDROCODONE-ACETAMINOPHEN 7.5-325 MG PO TABS: 1.0000 | ORAL_TABLET | Freq: Two times a day (BID) | ORAL | 0 refills | Status: DC | PRN

## 2022-07-23 NOTE — Telephone Encounter (Signed)
Will you please change Mr. Tadlock Hydrocodone to 7.5-325 to [facility] in [place]. The prior on has been cancelled.

## 2022-07-23 NOTE — Addendum Note (Signed)
Addended by: Fanny Dance on: 07/23/2022 04:54 PM   Modules accepted: Orders

## 2022-07-23 NOTE — Telephone Encounter (Signed)
Patient called to find out where his Rx was sent yesterday. It was sent to [facility]. (He has been called back 3 times. We cannot leave a message because his voicemail is full).

## 2022-07-23 NOTE — Addendum Note (Signed)
Addended by: Becky Sax on: 07/23/2022 10:41 AM   Modules accepted: Orders

## 2022-07-30 ENCOUNTER — Encounter: Payer: Self-pay | Admitting: Physical Medicine & Rehabilitation

## 2022-09-07 ENCOUNTER — Encounter: Payer: Self-pay | Admitting: Internal Medicine

## 2022-09-07 ENCOUNTER — Other Ambulatory Visit: Payer: Self-pay

## 2022-09-07 MED ORDER — CLOBETASOL PROPIONATE 0.05 % EX CREA: 1.0000 | TOPICAL_CREAM | Freq: Two times a day (BID) | CUTANEOUS | 2 refills | Status: AC

## 2022-09-30 ENCOUNTER — Encounter: Payer: Managed Care, Other (non HMO) | Admitting: Physical Medicine & Rehabilitation

## 2022-10-04 ENCOUNTER — Encounter
Payer: Managed Care, Other (non HMO) | Attending: Physical Medicine & Rehabilitation | Admitting: Physical Medicine & Rehabilitation

## 2022-10-04 ENCOUNTER — Encounter: Payer: Self-pay | Admitting: Physical Medicine & Rehabilitation

## 2022-10-04 VITALS — BP 120/82 | HR 69 | Ht 72.0 in | Wt 231.0 lb

## 2022-10-04 DIAGNOSIS — G8929 Other chronic pain: Secondary | ICD-10-CM | POA: Insufficient documentation

## 2022-10-04 DIAGNOSIS — M25511 Pain in right shoulder: Secondary | ICD-10-CM | POA: Diagnosis present

## 2022-10-04 DIAGNOSIS — Z5181 Encounter for therapeutic drug level monitoring: Secondary | ICD-10-CM | POA: Diagnosis present

## 2022-10-04 DIAGNOSIS — Z79891 Long term (current) use of opiate analgesic: Secondary | ICD-10-CM | POA: Diagnosis present

## 2022-10-04 DIAGNOSIS — G894 Chronic pain syndrome: Secondary | ICD-10-CM | POA: Diagnosis present

## 2022-10-04 MED ORDER — DICLOFENAC SODIUM 1 % EX GEL: 2.0000 g | Freq: Four times a day (QID) | CUTANEOUS | 6 refills | Status: AC

## 2022-10-04 MED ORDER — HYDROCODONE-ACETAMINOPHEN 7.5-325 MG PO TABS: 1.0000 | ORAL_TABLET | Freq: Two times a day (BID) | ORAL | 0 refills | Status: DC | PRN
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
## 2022-11-24 ENCOUNTER — Telehealth: Payer: Self-pay | Admitting: *Deleted

## 2022-11-24 NOTE — Telephone Encounter (Signed)
-----   Message from Fanny Dance sent at 11/17/2022  4:16 PM EDT ----- Peri Jefferson afternoon, can we please do warning letter for ETOH, thanks ----- Message ----- From: Nell Range Lab Results In Sent: 10/08/2022   5:37 AM EDT To: Fanny Dance, MD

## 2022-11-24 NOTE — Telephone Encounter (Signed)
Formal warning letter for combining ETOH while being prescribed narcotic will be sent through Mychart.

## 2023-01-03 ENCOUNTER — Encounter: Payer: Managed Care, Other (non HMO) | Admitting: Physical Medicine & Rehabilitation

## 2023-01-06 ENCOUNTER — Encounter
Payer: Managed Care, Other (non HMO) | Attending: Physical Medicine & Rehabilitation | Admitting: Physical Medicine & Rehabilitation
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
## 2023-01-17 ENCOUNTER — Encounter: Payer: Managed Care, Other (non HMO) | Admitting: Physical Medicine & Rehabilitation

## 2023-02-03 ENCOUNTER — Encounter: Payer: Self-pay | Admitting: Physical Medicine & Rehabilitation

## 2023-02-03 ENCOUNTER — Encounter
Payer: Managed Care, Other (non HMO) | Attending: Physical Medicine & Rehabilitation | Admitting: Physical Medicine & Rehabilitation

## 2023-02-03 VITALS — BP 135/76 | HR 85 | Ht 72.0 in | Wt 236.0 lb

## 2023-02-03 DIAGNOSIS — Z79891 Long term (current) use of opiate analgesic: Secondary | ICD-10-CM | POA: Insufficient documentation

## 2023-02-03 DIAGNOSIS — G894 Chronic pain syndrome: Secondary | ICD-10-CM | POA: Insufficient documentation

## 2023-02-03 DIAGNOSIS — M25511 Pain in right shoulder: Secondary | ICD-10-CM | POA: Diagnosis not present

## 2023-02-03 DIAGNOSIS — G8929 Other chronic pain: Secondary | ICD-10-CM | POA: Insufficient documentation

## 2023-02-03 MED ORDER — OXYCODONE HCL 5 MG PO TABS
5.0000 mg | ORAL_TABLET | Freq: Two times a day (BID) | ORAL | 0 refills | Status: DC | PRN
Start: 2023-02-03 — End: 2023-02-03

## 2023-02-03 MED ORDER — OXYCODONE HCL 5 MG PO TABS
5.0000 mg | ORAL_TABLET | Freq: Two times a day (BID) | ORAL | 0 refills | Status: DC | PRN
Start: 2023-02-03 — End: 2023-04-08
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
## 2023-02-04 ENCOUNTER — Telehealth: Payer: Self-pay

## 2023-02-04 NOTE — Telephone Encounter (Signed)
PA for oxycodone submitted through cover my meds  Aidian Whatley (Key: [id number])

## 2023-04-04 ENCOUNTER — Encounter: Payer: Managed Care, Other (non HMO) | Admitting: Physical Medicine & Rehabilitation

## 2023-04-08 ENCOUNTER — Other Ambulatory Visit: Payer: Self-pay | Admitting: Physical Medicine & Rehabilitation

## 2023-04-11 MED ORDER — OXYCODONE HCL 5 MG PO TABS: 5.0000 mg | ORAL_TABLET | Freq: Two times a day (BID) | ORAL | 0 refills | Status: DC | PRN

## 2023-04-24 ENCOUNTER — Encounter: Payer: Self-pay | Admitting: Internal Medicine

## 2023-04-24 NOTE — Patient Instructions (Addendum)
 Blood work was ordered.       Medications changes include :   sertraline 50 mg daily     Return in about 1 year (around 04/24/2024) for Physical Exam.    Health Maintenance, Male Adopting a healthy lifestyle and getting preventive care are important in promoting health and wellness. Ask your health care provider about: The right schedule for you to have regular tests and exams. Things you can do on your own to prevent diseases and keep yourself healthy. What should I know about diet, weight, and exercise? Eat a healthy diet  Eat a diet that includes plenty of vegetables, fruits, low-fat dairy products, and lean protein. Do not eat a lot of foods that are high in solid fats, added sugars, or sodium. Maintain a healthy weight Body mass index (BMI) is a measurement that can be used to identify possible weight problems. It estimates body fat based on height and weight. Your health care provider can help determine your BMI and help you achieve or maintain a healthy weight. Get regular exercise Get regular exercise. This is one of the most important things you can do for your health. Most adults should: Exercise for at least 150 minutes each week. The exercise should increase your heart rate and make you sweat (moderate-intensity exercise). Do strengthening exercises at least twice a week. This is in addition to the moderate-intensity exercise. Spend less time sitting. Even light physical activity can be beneficial. Watch cholesterol and blood lipids Have your blood tested for lipids and cholesterol at 41 years of age, then have this test every 5 years. You may need to have your cholesterol levels checked more often if: Your lipid or cholesterol levels are high. You are older than 41 years of age. You are at high risk for heart disease. What should I know about cancer screening? Many types of cancers can be detected early and may often be prevented. Depending on your health  history and family history, you may need to have cancer screening at various ages. This may include screening for: Colorectal cancer. Prostate cancer. Skin cancer. Lung cancer. What should I know about heart disease, diabetes, and high blood pressure? Blood pressure and heart disease High blood pressure causes heart disease and increases the risk of stroke. This is more likely to develop in people who have high blood pressure readings or are overweight. Talk with your health care provider about your target blood pressure readings. Have your blood pressure checked: Every 3-5 years if you are 28-31 years of age. Every year if you are 42 years old or older. If you are between the ages of 7 and 28 and are a current or former smoker, ask your health care provider if you should have a one-time screening for abdominal aortic aneurysm (AAA). Diabetes Have regular diabetes screenings. This checks your fasting blood sugar level. Have the screening done: Once every three years after age 53 if you are at a normal weight and have a low risk for diabetes. More often and at a younger age if you are overweight or have a high risk for diabetes. What should I know about preventing infection? Hepatitis B If you have a higher risk for hepatitis B, you should be screened for this virus. Talk with your health care provider to find out if you are at risk for hepatitis B infection. Hepatitis C Blood testing is recommended for: Everyone born from [birth year] through 1963. Anyone with known risk factors for  hepatitis C. Sexually transmitted infections (STIs) You should be screened each year for STIs, including gonorrhea and chlamydia, if: You are sexually active and are younger than 41 years of age. You are older than 41 years of age and your health care provider tells you that you are at risk for this type of infection. Your sexual activity has changed since you were last screened, and you are at increased risk for  chlamydia or gonorrhea. Ask your health care provider if you are at risk. Ask your health care provider about whether you are at high risk for HIV. Your health care provider may recommend a prescription medicine to help prevent HIV infection. If you choose to take medicine to prevent HIV, you should first get tested for HIV. You should then be tested every 3 months for as long as you are taking the medicine. Follow these instructions at home: Alcohol use Do not drink alcohol if your health care provider tells you not to drink. If you drink alcohol: Limit how much you have to 0-2 drinks a day. Know how much alcohol is in your drink. In the [place] one drink equals one 12 oz bottle of beer (355 mL), one 5 oz glass of wine (148 mL), or one 1 oz glass of hard liquor (44 mL). Lifestyle Do not use any products that contain nicotine or tobacco. These products include cigarettes, chewing tobacco, and vaping devices, such as e-cigarettes. If you need help quitting, ask your health care provider. Do not use street drugs. Do not share needles. Ask your health care provider for help if you need support or information about quitting drugs. General instructions Schedule regular health, dental, and eye exams. Stay current with your vaccines. Tell your health care provider if: You often feel depressed. You have ever been abused or do not feel safe at home. Summary Adopting a healthy lifestyle and getting preventive care are important in promoting health and wellness. Follow your health care provider's instructions about healthy diet, exercising, and getting tested or screened for diseases. Follow your health care provider's instructions on monitoring your cholesterol and blood pressure. This information is not intended to replace advice given to you by your health care provider. Make sure you discuss any questions you have with your health care provider. Document Revised: 06/30/2020 Document Reviewed:  06/30/2020 Elsevier Patient Education  2024 [employer].

## 2023-04-24 NOTE — Progress Notes (Unsigned)
 Subjective:    Patient ID: Jamie Powell, male    DOB: [birth year], 43 y.o.   MRN: [id number]     HPI Jamie Powell is here for a physical exam and his chronic medical problems.   Work is stressful.  Home can be stressful - he has two young kids.  He is trying to get another job, but needs to first do his resume.   Medications and allergies reviewed with patient and updated if appropriate.  Current Outpatient Medications on File Prior to Visit  Medication Sig Dispense Refill   acetaminophen (TYLENOL) 325 MG tablet Take by mouth.     clobetasol cream (TEMOVATE) 0.05 % Apply 1 Application topically 2 (two) times daily. 45 g 2   diclofenac Sodium (VOLTAREN ARTHRITIS PAIN) 1 % GEL Apply 2 g topically 4 (four) times daily. 150 g 6   omeprazole (PRILOSEC) 40 MG capsule Take 1 capsule (40 mg total) by mouth 2 (two) times daily before a meal. Take 30 minutes prior to a meal 180 capsule 3   oxyCODONE (ROXICODONE) 5 MG immediate release tablet Take 1 tablet (5 mg total) by mouth every 12 (twelve) hours as needed for severe pain (pain score 7-10). 60 tablet 0   triamcinolone ointment (KENALOG) 0.5 % Apply 1 application topically 2 (two) times daily. 30 g 3   SUMAtriptan (IMITREX) 20 MG/ACT nasal spray PLACE 1 SPRAY INTO THE NOSE AS NEEDED FOR MIGRAINE OR HEADACHE. MAY REPEAT ONCE IN 2 HOURS IF HEADACHE PERSISTS OR RECURS. MAX OF 2 SPRAYS PER DAY 10 each 1   SUMAtriptan (IMITREX) 6 MG/0.5ML SOLN injection INJECT 0.5 MLS INTO THE SKIN ONCE AS NEEDED FOR UP TO 1 DOSE FOR MIGRAINE OR HEADACHE. MAY REPEAT IN 1 HOUR IF HEADACHE PERSISTS OR RECURS 4 mL 5   No current facility-administered medications on file prior to visit.    Review of Systems  Constitutional:  Negative for fever.  Eyes:  Negative for visual disturbance.  Respiratory:  Negative for cough, shortness of breath and wheezing.   Cardiovascular:  Negative for chest pain, palpitations and leg swelling.  Gastrointestinal:  Negative for  abdominal pain, blood in stool, constipation and diarrhea.       Freuqent gerd  Genitourinary:  Negative for difficulty urinating.  Musculoskeletal:  Positive for arthralgias (shoulder pain). Negative for back pain.  Skin:  Negative for rash.  Neurological:  Negative for light-headedness and headaches.  Psychiatric/Behavioral:  Negative for dysphoric mood. The patient is nervous/anxious (stress).        Objective:   Vitals:   04/25/23 1505  BP: 110/78  Pulse: 89  Temp: 98.6 F (37 C)  SpO2: 95%   Filed Weights   04/25/23 1505  Weight: 237 lb 4 oz (107.6 kg)   Body mass index is 32.18 kg/m.  BP Readings from Last 3 Encounters:  04/25/23 110/78  02/03/23 135/76  10/04/22 120/82    Wt Readings from Last 3 Encounters:  04/25/23 237 lb 4 oz (107.6 kg)  02/03/23 236 lb (107 kg)  10/04/22 231 lb (104.8 kg)      Physical Exam Constitutional: He appears well-developed and well-nourished. No distress.  HENT:  Head: Normocephalic and atraumatic.  Right Ear: External ear normal.  Left Ear: External ear normal.  Normal ear canals and TM b/l  Mouth/Throat: Oropharynx is clear and moist. Eyes: Conjunctivae and EOM are normal.  Neck: Neck supple. No tracheal deviation present. No thyromegaly present.  No carotid bruit  Cardiovascular: Normal rate,  regular rhythm, normal heart sounds and intact distal pulses.   No murmur heard.  No lower extremity edema. Pulmonary/Chest: Effort normal and breath sounds normal. No respiratory distress. He has no wheezes. He has no rales.  Abdominal: Soft. He exhibits no distension. There is no tenderness.  Genitourinary: deferred  Lymphadenopathy:   He has no cervical adenopathy.  Skin: Skin is warm and dry. He is not diaphoretic.  Psychiatric: He has a normal mood and affect. His behavior is normal.    The 10-year ASCVD risk score (Arnett DK, et al., 2019) is: 1.6%   Values used to calculate the score:     Age: 47 years     Sex: Male      Is Non-Hispanic African American: No     Diabetic: No     Tobacco smoker: No     Systolic Blood Pressure: 110 mmHg     Is BP treated: No     HDL Cholesterol: 38.8 mg/dL     Total Cholesterol: 231 mg/dL       Assessment & Plan:   Physical exam: Screening blood work  ordered Exercise   no formal exercise-stressed to regular exercise outside of daily activities Weight obese-encouraged weight loss Substance abuse   Friday may have a 6 pack Friday and a 12 pack Saturday and Sunday, he knows he needs to not drink as much on the weekends and realizes it is related to his stress level.  Dips only-he knows he should quit   Reviewed recommended immunizations.   Health Maintenance  Topic Date Due   COVID-19 Vaccine (2 - 2024-25 season) 05/11/2023 (Originally 10/24/2022)   INFLUENZA VACCINE  05/23/2023 (Originally 09/23/2022)   DTaP/Tdap/Td (2 - Td or Tdap) 01/20/2026   Hepatitis C Screening  Completed   HIV Screening  Completed   HPV VACCINES  Aged Out     See Problem List for Assessment and Plan of chronic medical problems.

## 2023-04-25 ENCOUNTER — Ambulatory Visit (INDEPENDENT_AMBULATORY_CARE_PROVIDER_SITE_OTHER): Payer: Managed Care, Other (non HMO) | Admitting: Internal Medicine

## 2023-04-25 VITALS — BP 110/78 | HR 89 | Temp 98.6°F | Ht 72.0 in | Wt 237.2 lb

## 2023-04-25 DIAGNOSIS — M25511 Pain in right shoulder: Secondary | ICD-10-CM

## 2023-04-25 DIAGNOSIS — E7849 Other hyperlipidemia: Secondary | ICD-10-CM

## 2023-04-25 DIAGNOSIS — Z0000 Encounter for general adult medical examination without abnormal findings: Secondary | ICD-10-CM | POA: Diagnosis not present

## 2023-04-25 DIAGNOSIS — G44019 Episodic cluster headache, not intractable: Secondary | ICD-10-CM | POA: Diagnosis not present

## 2023-04-25 DIAGNOSIS — K219 Gastro-esophageal reflux disease without esophagitis: Secondary | ICD-10-CM

## 2023-04-25 DIAGNOSIS — F419 Anxiety disorder, unspecified: Secondary | ICD-10-CM | POA: Insufficient documentation

## 2023-04-25 DIAGNOSIS — G8929 Other chronic pain: Secondary | ICD-10-CM

## 2023-04-25 MED ORDER — SERTRALINE HCL 50 MG PO TABS: 50.0000 mg | ORAL_TABLET | Freq: Every day | ORAL | 1 refills | Status: DC

## 2023-04-25 NOTE — Assessment & Plan Note (Signed)
 Acute Has a lot of stress denies any true anxiety or worry, but I think it is there Denies depression He does not drink more than he should on the weekends which she understands is because of the stress Does not drink during the week Encouraged him to try to decrease his drinking on the weekends Needs to find other ways to manage his stress and deal with his stress Start sertraline 50 mg daily Will try to get his resume done so we can apply for other jobs which will definitely help the stress level

## 2023-04-25 NOTE — Assessment & Plan Note (Addendum)
 Chronic Not ideally controlled Continue omeprazole 20 mg mg bid OTC-insurance does not cover other medications

## 2023-04-25 NOTE — Assessment & Plan Note (Addendum)
 Chronic Occ migraines-none recently No recent cluster headaches Imitrex nasal spray or injections as needed

## 2023-04-25 NOTE — Assessment & Plan Note (Signed)
 Chronic Following with pain management Using Voltaren gel Taking oxycodone 5 mg every 12 hours

## 2023-04-25 NOTE — Assessment & Plan Note (Signed)
 Chronic Check lipid panel, TSH, CMP, CBC Low ASCVD risk Continue lifestyle control Regular exercise, weight loss and healthy diet encouraged

## 2023-05-16 ENCOUNTER — Encounter
Payer: Managed Care, Other (non HMO) | Attending: Physical Medicine & Rehabilitation | Admitting: Physical Medicine & Rehabilitation

## 2023-05-16 DIAGNOSIS — M25511 Pain in right shoulder: Secondary | ICD-10-CM | POA: Insufficient documentation

## 2023-05-16 DIAGNOSIS — G894 Chronic pain syndrome: Secondary | ICD-10-CM | POA: Insufficient documentation

## 2023-05-16 DIAGNOSIS — Z79891 Long term (current) use of opiate analgesic: Secondary | ICD-10-CM | POA: Insufficient documentation

## 2023-05-16 DIAGNOSIS — G8929 Other chronic pain: Secondary | ICD-10-CM | POA: Insufficient documentation

## 2023-06-02 ENCOUNTER — Encounter: Admitting: Physical Medicine & Rehabilitation

## 2023-06-08 ENCOUNTER — Encounter: Payer: Self-pay | Admitting: Internal Medicine

## 2023-06-08 DIAGNOSIS — F419 Anxiety disorder, unspecified: Secondary | ICD-10-CM

## 2023-06-09 MED ORDER — FLUOXETINE HCL 20 MG PO CAPS: 20.0000 mg | ORAL_CAPSULE | Freq: Every day | ORAL | 5 refills | Status: DC

## 2023-07-06 MED ORDER — VENLAFAXINE HCL ER 37.5 MG PO CP24: 37.5000 mg | ORAL_CAPSULE | Freq: Every day | ORAL | 5 refills | Status: DC

## 2023-07-06 NOTE — Addendum Note (Signed)
 Addended by: Colene Dauphin on: 07/06/2023 12:51 PM   Modules accepted: Orders

## 2023-07-15 ENCOUNTER — Encounter: Admitting: Physical Medicine & Rehabilitation

## 2023-07-21 NOTE — Addendum Note (Signed)
 Addended by: Colene Dauphin on: 07/21/2023 04:18 PM   Modules accepted: Orders

## 2023-07-28 MED ORDER — ESCITALOPRAM OXALATE 10 MG PO TABS: 10.0000 mg | ORAL_TABLET | Freq: Every day | ORAL | 5 refills | Status: DC

## 2023-07-28 NOTE — Addendum Note (Signed)
 Addended by: Colene Dauphin on: 07/28/2023 02:38 PM   Modules accepted: Orders

## 2023-08-14 ENCOUNTER — Encounter (HOSPITAL_BASED_OUTPATIENT_CLINIC_OR_DEPARTMENT_OTHER): Payer: Self-pay

## 2023-08-14 ENCOUNTER — Other Ambulatory Visit: Payer: Self-pay

## 2023-08-14 ENCOUNTER — Emergency Department (HOSPITAL_BASED_OUTPATIENT_CLINIC_OR_DEPARTMENT_OTHER): Admission: EM | Admit: 2023-08-14 | Discharge: 2023-08-14 | Disposition: A | Discharge status: Home / Self Care

## 2023-08-14 ENCOUNTER — Emergency Department (HOSPITAL_BASED_OUTPATIENT_CLINIC_OR_DEPARTMENT_OTHER)

## 2023-08-14 DIAGNOSIS — W293XXA Contact with powered garden and outdoor hand tools and machinery, initial encounter: Secondary | ICD-10-CM | POA: Insufficient documentation

## 2023-08-14 DIAGNOSIS — S59911A Unspecified injury of right forearm, initial encounter: Secondary | ICD-10-CM | POA: Diagnosis present

## 2023-08-14 DIAGNOSIS — S51811A Laceration without foreign body of right forearm, initial encounter: Secondary | ICD-10-CM | POA: Diagnosis not present

## 2023-08-14 MED ORDER — LIDOCAINE-EPINEPHRINE (PF) 2 %-1:200000 IJ SOLN
10.0000 mL | Freq: Once | INTRAMUSCULAR | Status: AC
  Administered 2023-08-14: 10 mL
  Filled 2023-08-14: qty 20

## 2023-08-14 NOTE — Discharge Instructions (Signed)
 You had 4 non-absorbable sutures placed today. You must get your sutures rechecked for possible removal in 7-10 days. We recommend visiting your PCP or an urgent care for suture removal. However, you may also return back to the ER if you are unable to be seen by your PCP or at urgent care.   You may gently clean the area around your laceration as needed with water. Place antibiotic ointment such as bacitracin or neosporin over your laceration after cleaning the area.  Keep the laceration covered with sterile gauze as shown here if you are doing an activity in which it may get dirty. You may pick these supplies up at any drugstore.  Do not submerge your laceration in water (no baths, swimming) until it is fully healed. You may shower.   You may take up to 1000mg  of tylenol every 6 hours as needed for pain. Do not take more then 4g per day.   You may use up to 600mg  ibuprofen every 6 hours as needed for pain.  Do not exceed 2.4g of ibuprofen per day.  Return to the ER should you develop fever, chills, pus drainage from your wound, redness around your wound.

## 2023-08-14 NOTE — ED Provider Notes (Signed)
 [name] Provider Note   CSN: [id number] Arrival date & time: 08/14/23  1637     Patient presents with: Laceration   Jamie Powell is a 43 y.o. male with no significant past medical history presents with concern for laceration to the right forearm sustained just prior to arrival.  States he reached across his chainsaw, and caught himself on the blade.  Bleeding well-controlled upon arrival.  Denies any numbness and tingling in the right upper extremity.  No difficulty with range of motion of the right upper extremity.  Reports tetanus was updated within the last 5 years.    Laceration      Prior to Admission medications   Medication Sig Start Date End Date Taking? Authorizing Provider  acetaminophen  (TYLENOL ) 325 MG tablet Take by mouth.    [provider]  clobetasol  cream (TEMOVATE ) 0.05 % Apply 1 Application topically 2 (two) times daily. 09/07/22   Geofm Glade PARAS, MD  diclofenac  Sodium (VOLTAREN  ARTHRITIS PAIN) 1 % GEL Apply 2 g topically 4 (four) times daily. 10/04/22   Urbano Albright, MD  escitalopram  (LEXAPRO ) 10 MG tablet Take 1 tablet (10 mg total) by mouth daily. 07/28/23   Geofm Glade PARAS, MD  omeprazole  (PRILOSEC) 40 MG capsule Take 1 capsule (40 mg total) by mouth 2 (two) times daily before a meal. Take 30 minutes prior to a meal 02/03/22   Burns, Glade PARAS, MD  oxyCODONE  (ROXICODONE ) 5 MG immediate release tablet Take 1 tablet (5 mg total) by mouth every 12 (twelve) hours as needed for severe pain (pain score 7-10). 04/11/23   Urbano Albright, MD  SUMAtriptan  (IMITREX ) 20 MG/ACT nasal spray PLACE 1 SPRAY INTO THE NOSE AS NEEDED FOR MIGRAINE OR HEADACHE. MAY REPEAT ONCE IN 2 HOURS IF HEADACHE PERSISTS OR RECURS. MAX OF 2 SPRAYS PER DAY 05/08/20 05/08/21  Burns, Glade PARAS, MD  SUMAtriptan  (IMITREX ) 6 MG/0.5ML SOLN injection INJECT 0.5 MLS INTO THE SKIN ONCE AS NEEDED FOR UP TO 1 DOSE FOR MIGRAINE OR HEADACHE. MAY REPEAT IN 1  HOUR IF HEADACHE PERSISTS OR RECURS 02/03/22 02/03/23  Geofm Glade PARAS, MD  triamcinolone  ointment (KENALOG ) 0.5 % Apply 1 application topically 2 (two) times daily. 01/26/19   Geofm Glade PARAS, MD    Allergies: Patient has no known allergies.    Review of Systems  Skin:  Positive for wound.    Updated Vital Signs BP 118/82 (BP Location: Left Arm)   Pulse 91   Temp 97.6 F (36.4 C)   Resp 20   Wt 106.6 kg   SpO2 97%   BMI 31.87 kg/m   Physical Exam Vitals and nursing note reviewed.  Constitutional:      Appearance: Normal appearance.  HENT:     Head: Atraumatic.   Cardiovascular:     Comments: Radial pulse 2+ right upper extremity Pulmonary:     Effort: Pulmonary effort is normal.   Musculoskeletal:     Comments: Able to fully flex and extend at the right elbow, right wrist.  Full flexion extension at the 1st through 5th MCPs, PIP, DIP joints of the right upper extremity   Skin:        Comments: 3 cm laceration to the anterior right forearm.  Bleeding well-controlled upon arrival.  No visualization of bone or tendon.  Mild dirt debris in the wound but no obvious foreign body   Neurological:     General: No focal deficit present.  Mental Status: He is alert.     Comments: Intact sensation of the right upper extremity  Psychiatric:        Mood and Affect: Mood normal.        Behavior: Behavior normal.     (all labs ordered are listed, but only abnormal results are displayed) Labs Reviewed - No data to display  EKG: None  Radiology: DG Forearm Right Result Date: 08/14/2023 CLINICAL DATA:  Laceration, evaluate for foreign body. EXAM: RIGHT FOREARM - 2 VIEW COMPARISON:  None Available. FINDINGS: There is no evidence of fracture or other focal bone lesions. Soft tissues are unremarkable. IMPRESSION: Negative. Electronically Signed   By: Greig Pique M.D.   On: 08/14/2023 17:12     .Laceration Repair  Date/Time: 08/14/2023 5:54 PM  Performed by: Veta Palma, PA-C Authorized by: Veta Palma, PA-C   Consent:    Consent obtained:  Verbal   Consent given by:  Patient   Risks, benefits, and alternatives were discussed: yes     Risks discussed:  Infection, pain and poor cosmetic result   Alternatives discussed:  No treatment Universal protocol:    Patient identity confirmed:  Verbally with patient Anesthesia:    Anesthesia method:  Local infiltration   Local anesthetic:  Lidocaine  1% WITH epi (3ml) Laceration details:    Location:  Shoulder/arm   Shoulder/arm location:  R lower arm   Length (cm):  3   Depth (mm):  2 Pre-procedure details:    Preparation:  Imaging obtained to evaluate for foreign bodies and patient was prepped and draped in usual sterile fashion Exploration:    Imaging obtained: x-ray     Imaging outcome: foreign body not noted     Wound exploration: wound explored through full range of motion and entire depth of wound visualized     Wound extent: fascia not violated, no foreign body, no signs of injury, no nerve damage, no tendon damage, no underlying fracture and no vascular damage   Treatment:    Area cleansed with:  Chlorhexidine   Amount of cleaning:  Standard   Irrigation solution:  Sterile saline   Irrigation volume:  1L   Irrigation method:  Syringe   Debridement:  Moderate Skin repair:    Repair method:  Sutures   Suture size:  4-0   Suture material:  Prolene   Suture technique:  Simple interrupted   Number of sutures:  4 Repair type:    Repair type:  Simple Post-procedure details:    Dressing:  Antibiotic ointment and sterile dressing   Procedure completion:  Tolerated well, no immediate complications    Medications Ordered in the ED  lidocaine -EPINEPHrine (XYLOCAINE  W/EPI) 2 %-1:200000 (PF) injection 10 mL (10 mLs Infiltration Given 08/14/23 1656)                                    Medical Decision Making Amount and/or Complexity of Data Reviewed Radiology:  ordered.  Risk Prescription drug management.     Differential diagnosis includes but is not limited to abrasion, laceration, tendon injury, nerve injury, vascular injury  ED Course:  Upon initial evaluation, patient is well-appearing, no acute distress.  Stable vitals.  Has 3 cm laceration to the anterior right forearm.  Bleeding well-controlled upon arrival.  No acute debris noted, but no obvious foreign body.  No visualization of tendon or bone.  Neurovascular intact in the right upper  extremity.  Tetanus is up-to-date per patient.   Imaging Studies ordered: I ordered imaging studies including x-ray right forearm I independently visualized the imaging with scope of interpretation limited to determining acute life threatening conditions related to emergency care. Imaging showed no foreign debris, no acute fracture or dislocation I agree with the radiologist interpretation   Medications Given: Lidocaine  with epi  Consent obtained from patient for laceration repair.  Area was cleaned with chlorhexidine and anesthetized with lidocaine  with epi.  Irrigated with 1 L sterile saline.  Closed with 4 simple interrupted suture.  Remains neurovascularly intact after suture placement.  Patient tolerated this well.  Stable and appropriate for discharge home.    Impression: Laceration to right forearm  Disposition:  The patient was discharged home with instructions to  have sutures removed in 7 to 10 days.  Keep area clean with soap and water.  Do not submerge until fully healed.  Tylenol  or ibuprofen as needed for pain. Return precautions given.     This chart was dictated using voice recognition software, Dragon. Despite the best efforts of this provider to proofread and correct errors, errors may still occur which can change documentation meaning.       Final diagnoses:  Forearm laceration, right, initial encounter    ED Discharge Orders     None          Veta Palma, DEVONNA 08/14/23 ROSENA Ula Prentice JONELLE, MD 08/14/23 2325

## 2023-08-14 NOTE — ED Triage Notes (Signed)
 Laceration to right lower arm .Pt sharpening a chain saw blade and hit right arm on tooth of blade. Bleeding controlled

## 2023-09-09 ENCOUNTER — Encounter: Admitting: Physical Medicine & Rehabilitation

## 2023-10-10 ENCOUNTER — Encounter: Payer: Self-pay | Admitting: Internal Medicine

## 2023-10-10 DIAGNOSIS — F1010 Alcohol abuse, uncomplicated: Secondary | ICD-10-CM

## 2023-10-10 DIAGNOSIS — F419 Anxiety disorder, unspecified: Secondary | ICD-10-CM

## 2023-10-18 ENCOUNTER — Other Ambulatory Visit: Payer: Self-pay | Admitting: Internal Medicine

## 2023-10-24 ENCOUNTER — Encounter: Payer: Self-pay | Admitting: Internal Medicine

## 2023-10-24 DIAGNOSIS — F1010 Alcohol abuse, uncomplicated: Secondary | ICD-10-CM | POA: Insufficient documentation

## 2023-10-24 NOTE — Progress Notes (Unsigned)
    Subjective:    Patient ID: Jamie Powell, male    DOB: 07/18/1980, 43 y.o.   MRN: [id number]      HPI Jamie Powell is here for No chief complaint on file.   Naltrexone - can not use  Acamprosate -  start after abstinence  - 666 mg TID - continue if relapse.  Dec cravings, do not get sick if drink SE - diarrhea, HA, insomnia, anx, dep, sweats,    Antabuse  - 250 mg daily -> 500 mg daily -- must be abstinent > 12 hr. .    iF drinks w/iin 10-30 min - N, flushing , HA, CP, pals, possible chest tightness, sweating, anxiety   Off label - gabapentin ( eases withdrawal), topamax, baclofen ( both reduce cravings)   Medications and allergies reviewed with patient and updated if appropriate.  Current Outpatient Medications on File Prior to Visit  Medication Sig Dispense Refill   acetaminophen  (TYLENOL ) 325 MG tablet Take by mouth.     clobetasol  cream (TEMOVATE ) 0.05 % Apply 1 Application topically 2 (two) times daily. 45 g 2   diclofenac  Sodium (VOLTAREN  ARTHRITIS PAIN) 1 % GEL Apply 2 g topically 4 (four) times daily. 150 g 6   escitalopram  (LEXAPRO ) 10 MG tablet TAKE 1 TABLET BY MOUTH EVERY DAY 90 tablet 1   omeprazole  (PRILOSEC) 40 MG capsule Take 1 capsule (40 mg total) by mouth 2 (two) times daily before a meal. Take 30 minutes prior to a meal 180 capsule 3   oxyCODONE  (ROXICODONE ) 5 MG immediate release tablet Take 1 tablet (5 mg total) by mouth every 12 (twelve) hours as needed for severe pain (pain score 7-10). 60 tablet 0   SUMAtriptan  (IMITREX ) 20 MG/ACT nasal spray PLACE 1 SPRAY INTO THE NOSE AS NEEDED FOR MIGRAINE OR HEADACHE. MAY REPEAT ONCE IN 2 HOURS IF HEADACHE PERSISTS OR RECURS. MAX OF 2 SPRAYS PER DAY 10 each 1   SUMAtriptan  (IMITREX ) 6 MG/0.5ML SOLN injection INJECT 0.5 MLS INTO THE SKIN ONCE AS NEEDED FOR UP TO 1 DOSE FOR MIGRAINE OR HEADACHE. MAY REPEAT IN 1 HOUR IF HEADACHE PERSISTS OR RECURS 4 mL 5   triamcinolone  ointment (KENALOG ) 0.5 % Apply 1 application topically  2 (two) times daily. 30 g 3   No current facility-administered medications on file prior to visit.    Review of Systems     Objective:  There were no vitals filed for this visit. BP Readings from Last 3 Encounters:  08/14/23 118/82  04/25/23 110/78  02/03/23 135/76   Wt Readings from Last 3 Encounters:  08/14/23 235 lb (106.6 kg)  04/25/23 237 lb 4 oz (107.6 kg)  02/03/23 236 lb (107 kg)   There is no height or weight on file to calculate BMI.    Physical Exam         Assessment & Plan:    See Problem List for Assessment and Plan of chronic medical problems.

## 2023-10-25 ENCOUNTER — Ambulatory Visit: Admitting: Internal Medicine

## 2023-10-25 VITALS — BP 120/90 | HR 102 | Temp 98.0°F | Ht 72.0 in | Wt 235.0 lb

## 2023-10-25 DIAGNOSIS — F419 Anxiety disorder, unspecified: Secondary | ICD-10-CM | POA: Diagnosis not present

## 2023-10-25 DIAGNOSIS — F1010 Alcohol abuse, uncomplicated: Secondary | ICD-10-CM

## 2023-10-25 MED ORDER — DISULFIRAM 250 MG PO TABS: 250.0000 mg | ORAL_TABLET | Freq: Every day | ORAL | 0 refills | Status: DC

## 2023-10-25 MED ORDER — HYDROCODONE-ACETAMINOPHEN 7.5-325 MG PO TABS: 1.0000 | ORAL_TABLET | Freq: Two times a day (BID) | ORAL | 0 refills | Status: AC | PRN

## 2023-10-25 NOTE — Assessment & Plan Note (Signed)
 Chronic Does have high anxiety and stress Advised that he really needs to be continuing the Lexapro  to help with that especially as he works on abstaining from alcohol Continue Lexapro  5 mg daily

## 2023-10-25 NOTE — Assessment & Plan Note (Addendum)
 Chronic Most days he does drink and often drinks 8-10 beers per night He can go a day or 2 without drinking and has gone up to 2 weeks, but always goes back to it Main reason for drinking he states is it is his security-related to stress at home Encouraged him to consider counseling for himself and even if if his wife does not want to go for marriage counseling-he deferred for now Discussed medication options including possible side effects He would like to try Antabuse -discussed that this side effects if he does drink alcohol while taking this medication are severe and most likely he would end up in the emergency room Discussed possible side effects Start Antabuse  250 mg daily Follow-up in 3 weeks-Will check LFTs at that time Discussed that he needs to be careful while on pain medication which she does not take on a daily basis because of CNS depression, which I think is unlikely

## 2023-10-25 NOTE — Patient Instructions (Addendum)
     Medications changes include :   disulfiram  250 mg daily - we can increase this to 500 mg daily.  Hydrocodone - acetaminophen  for severe pain only   Common Side Effects disulfiram -alcohol rxn rash drowsiness impotence headache acne metallic taste    Return in about 3 weeks (around 11/15/2023).

## 2023-10-26 ENCOUNTER — Telehealth: Payer: Self-pay

## 2023-10-26 ENCOUNTER — Other Ambulatory Visit (HOSPITAL_COMMUNITY): Payer: Self-pay

## 2023-10-26 NOTE — Telephone Encounter (Signed)
 Pharmacy Patient Advocate Encounter   Received notification from Pt Calls Messages that prior authorization for Hydrocodone /APAP 7.5/325mg  is required/requested.   Insurance verification completed.   The patient is insured through [employer] .   Per test claim: PA required and submitted KEY/EOC/Request #: [id number] from 10/26/23 to 10/25/24. Ran test claim, Copay is $8.05. This test claim was processed through [facility]- copay amounts may vary at other pharmacies due to pharmacy/plan contracts, or as the patient moves through the different stages of their insurance plan.   Left a message at [facility] to notify of the approval

## 2023-10-26 NOTE — Addendum Note (Signed)
 Addended by: GEOFM GLADE PARAS on: 10/26/2023 04:55 PM   Modules accepted: Orders

## 2023-10-27 ENCOUNTER — Encounter: Payer: Self-pay | Admitting: Internal Medicine

## 2023-11-03 ENCOUNTER — Encounter: Admitting: Physical Medicine & Rehabilitation

## 2023-11-14 NOTE — Progress Notes (Unsigned)
 Subjective:    Patient ID: Jamie Powell, male    DOB: 1980-11-21, 43 y.o.   MRN: [id number]     HPI Kymere is here for follow up of his chronic medical problems.  Was taking Antabuse  and that did help to deter him from drinking anything.  He stopped the medication last Friday, 4 days ago.  He has not had anything to drink since then.  He knows the medication is still in his system and that is keeping him from drinking.  Right now he does not even think about it.  He did buy a sixpack of beer the other day and feels that is all out of habit.   Discussed that we could try a different medication that he could take on a daily basis to help decrease cravings.  Right now he is not having any cravings and he would like to continue that medication for a little bit longer.  No side effects.  Lexapro  is working well and he feels his mood is good.     Medications and allergies reviewed with patient and updated if appropriate.  Current Outpatient Medications on File Prior to Visit  Medication Sig Dispense Refill   acetaminophen  (TYLENOL ) 325 MG tablet Take by mouth.     clobetasol  cream (TEMOVATE ) 0.05 % Apply 1 Application topically 2 (two) times daily. 45 g 2   diclofenac  Sodium (VOLTAREN  ARTHRITIS PAIN) 1 % GEL Apply 2 g topically 4 (four) times daily. 150 g 6   omeprazole  (PRILOSEC) 40 MG capsule Take 1 capsule (40 mg total) by mouth 2 (two) times daily before a meal. Take 30 minutes prior to a meal 180 capsule 3   oxyCODONE  (ROXICODONE ) 5 MG immediate release tablet Take 1 tablet (5 mg total) by mouth every 12 (twelve) hours as needed for severe pain (pain score 7-10). 60 tablet 0   SUMAtriptan  (IMITREX ) 20 MG/ACT nasal spray PLACE 1 SPRAY INTO THE NOSE AS NEEDED FOR MIGRAINE OR HEADACHE. MAY REPEAT ONCE IN 2 HOURS IF HEADACHE PERSISTS OR RECURS. MAX OF 2 SPRAYS PER DAY 10 each 1   SUMAtriptan  (IMITREX ) 6 MG/0.5ML SOLN injection INJECT 0.5 MLS INTO THE SKIN ONCE AS NEEDED FOR UP TO 1  DOSE FOR MIGRAINE OR HEADACHE. MAY REPEAT IN 1 HOUR IF HEADACHE PERSISTS OR RECURS 4 mL 5   triamcinolone  ointment (KENALOG ) 0.5 % Apply 1 application topically 2 (two) times daily. 30 g 3   No current facility-administered medications on file prior to visit.     Review of Systems     Objective:   Vitals:   11/15/23 1437  BP: 116/74  Pulse: 72  Temp: 98.3 F (36.8 C)  SpO2: 97%   BP Readings from Last 3 Encounters:  11/15/23 116/74  10/25/23 (!) 120/90  08/14/23 118/82   Wt Readings from Last 3 Encounters:  11/15/23 232 lb (105.2 kg)  10/25/23 235 lb (106.6 kg)  08/14/23 235 lb (106.6 kg)   Body mass index is 31.46 kg/m.    Physical Exam Constitutional:      General: He is not in acute distress.    Appearance: Normal appearance. He is not ill-appearing.  HENT:     Head: Normocephalic and atraumatic.  Skin:    General: Skin is warm and dry.  Neurological:     Mental Status: He is alert. Mental status is at baseline.  Psychiatric:        Mood and Affect: Mood normal.  Behavior: Behavior normal.        Thought Content: Thought content normal.        Judgment: Judgment normal.        Lab Results  Component Value Date   WBC 6.1 02/12/2022   HGB 14.7 02/12/2022   HCT 42.8 02/12/2022   PLT 268.0 02/12/2022   GLUCOSE 95 02/12/2022   CHOL 231 (H) 02/12/2022   TRIG 139.0 02/12/2022   HDL 38.80 (L) 02/12/2022   LDLDIRECT 160.0 02/13/2021   LDLCALC 165 (H) 02/12/2022   ALT 41 02/12/2022   AST 18 02/12/2022   NA 139 02/12/2022   K 4.2 02/12/2022   CL 103 02/12/2022   CREATININE 0.95 02/12/2022   BUN 11 02/12/2022   CO2 28 02/12/2022   TSH 0.72 02/12/2022     Assessment & Plan:    See Problem List for Assessment and Plan of chronic medical problems.

## 2023-11-14 NOTE — Telephone Encounter (Signed)
 Addressed.

## 2023-11-15 ENCOUNTER — Encounter: Payer: Self-pay | Admitting: Internal Medicine

## 2023-11-15 ENCOUNTER — Ambulatory Visit: Admitting: Internal Medicine

## 2023-11-15 VITALS — BP 116/74 | HR 72 | Temp 98.3°F | Ht 72.0 in | Wt 232.0 lb

## 2023-11-15 DIAGNOSIS — F1010 Alcohol abuse, uncomplicated: Secondary | ICD-10-CM

## 2023-11-15 DIAGNOSIS — F419 Anxiety disorder, unspecified: Secondary | ICD-10-CM | POA: Diagnosis not present

## 2023-11-15 MED ORDER — ESCITALOPRAM OXALATE 10 MG PO TABS: 10.0000 mg | ORAL_TABLET | Freq: Every day | ORAL | 1 refills | Status: AC

## 2023-11-15 MED ORDER — DISULFIRAM 250 MG PO TABS: 250.0000 mg | ORAL_TABLET | Freq: Every day | ORAL | 1 refills | Status: DC

## 2023-11-15 NOTE — Assessment & Plan Note (Signed)
 Chronic controlled Has high stress - work, finances Continue Lexapro  5 mg daily

## 2023-11-15 NOTE — Patient Instructions (Addendum)
      Medications changes include :   continue Antabuse      for therapy call-    [facility] [phone]  [facility]  [phone]  [facility] [phone]      Return in about 8 weeks (around 01/10/2024) for follow up.

## 2023-11-15 NOTE — Assessment & Plan Note (Signed)
 Chronic Has not had any alcohol since his last visit Has been taking Antabuse  daily and that his deterred him from drinking any-last dose of medication 4 days ago Encouraged him to do counseling to help avoid drinking excessively again - needs to break habit of buying beer without even thinking about it Can continue Antabuse  Follow-up in 8 weeks-Will check blood work at that time

## 2023-12-01 ENCOUNTER — Encounter: Attending: Physical Medicine & Rehabilitation | Admitting: Physical Medicine & Rehabilitation

## 2023-12-01 ENCOUNTER — Encounter: Payer: Self-pay | Admitting: Physical Medicine & Rehabilitation

## 2023-12-01 VITALS — BP 123/80 | HR 69 | Ht 72.0 in | Wt 232.0 lb

## 2023-12-01 DIAGNOSIS — G894 Chronic pain syndrome: Secondary | ICD-10-CM | POA: Insufficient documentation

## 2023-12-01 DIAGNOSIS — Z5181 Encounter for therapeutic drug level monitoring: Secondary | ICD-10-CM | POA: Diagnosis present

## 2023-12-01 DIAGNOSIS — Z79891 Long term (current) use of opiate analgesic: Secondary | ICD-10-CM | POA: Diagnosis not present

## 2023-12-01 MED ORDER — OXYCODONE HCL 5 MG PO TABS: 5.0000 mg | ORAL_TABLET | Freq: Three times a day (TID) | ORAL | 0 refills | Status: DC | PRN
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
## 2023-12-02 ENCOUNTER — Encounter: Admitting: Physical Medicine & Rehabilitation

## 2023-12-06 LAB — DRUG TOX MONITOR 1 W/CONF, ORAL FLD
Amphetamines: NEGATIVE ng/mL (ref <10)
Barbiturates: NEGATIVE ng/mL (ref <10)
Benzodiazepines: NEGATIVE ng/mL (ref <0.50)
Buprenorphine: NEGATIVE ng/mL (ref <0.10)
Cocaine: NEGATIVE ng/mL (ref <5.0)
Cotinine: >250 ng/mL — ABNORMAL HIGH (ref <5.0)
Fentanyl: NEGATIVE ng/mL (ref <0.10)
Heroin Metabolite: NEGATIVE ng/mL (ref <1.0)
MARIJUANA: NEGATIVE ng/mL (ref <2.5)
MDMA: NEGATIVE ng/mL (ref <10)
Meprobamate: NEGATIVE ng/mL (ref <2.5)
Methadone: NEGATIVE ng/mL (ref <5.0)
Nicotine Metabolite: POSITIVE ng/mL — AB (ref <5.0)
Opiates: NEGATIVE ng/mL (ref <2.5)
Phencyclidine: NEGATIVE ng/mL (ref <10)
Tapentadol: NEGATIVE ng/mL (ref <5.0)
Tramadol: NEGATIVE ng/mL (ref <5.0)
Zolpidem: NEGATIVE ng/mL (ref <5.0)

## 2023-12-06 LAB — DRUG TOX ALC METAB W/CON, ORAL FLD: Alcohol Metabolite: NEGATIVE ng/mL (ref <25)

## 2023-12-17 ENCOUNTER — Other Ambulatory Visit: Payer: Self-pay | Admitting: Internal Medicine

## 2024-01-10 ENCOUNTER — Ambulatory Visit: Admitting: Internal Medicine

## 2024-01-22 NOTE — Progress Notes (Deleted)
      Subjective:    Patient ID: Jamie Powell, male    DOB: 1980-10-19, 43 y.o.   MRN: [id number]     HPI Toron is here for follow up of his chronic medical problems.    Medications and allergies reviewed with patient and updated if appropriate.  Current Outpatient Medications on File Prior to Visit  Medication Sig Dispense Refill   acetaminophen  (TYLENOL ) 325 MG tablet Take by mouth.     clobetasol  cream (TEMOVATE ) 0.05 % Apply 1 Application topically 2 (two) times daily. 45 g 2   diclofenac  Sodium (VOLTAREN  ARTHRITIS PAIN) 1 % GEL Apply 2 g topically 4 (four) times daily. 150 g 6   disulfiram  (ANTABUSE ) 250 MG tablet TAKE 1 TABLET BY MOUTH EVERY DAY 90 tablet 1   escitalopram  (LEXAPRO ) 10 MG tablet Take 1 tablet (10 mg total) by mouth daily. 30 tablet 1   omeprazole  (PRILOSEC) 40 MG capsule Take 1 capsule (40 mg total) by mouth 2 (two) times daily before a meal. Take 30 minutes prior to a meal 180 capsule 3   oxyCODONE  (ROXICODONE ) 5 MG immediate release tablet Take 1 tablet (5 mg total) by mouth every 8 (eight) hours as needed for severe pain (pain score 7-10). 90 tablet 0   SUMAtriptan  (IMITREX ) 20 MG/ACT nasal spray PLACE 1 SPRAY INTO THE NOSE AS NEEDED FOR MIGRAINE OR HEADACHE. MAY REPEAT ONCE IN 2 HOURS IF HEADACHE PERSISTS OR RECURS. MAX OF 2 SPRAYS PER DAY 10 each 1   SUMAtriptan  (IMITREX ) 6 MG/0.5ML SOLN injection INJECT 0.5 MLS INTO THE SKIN ONCE AS NEEDED FOR UP TO 1 DOSE FOR MIGRAINE OR HEADACHE. MAY REPEAT IN 1 HOUR IF HEADACHE PERSISTS OR RECURS 4 mL 5   triamcinolone  ointment (KENALOG ) 0.5 % Apply 1 application topically 2 (two) times daily. 30 g 3   No current facility-administered medications on file prior to visit.     Review of Systems     Objective:  There were no vitals filed for this visit. BP Readings from Last 3 Encounters:  12/01/23 123/80  11/15/23 116/74  10/25/23 (!) 120/90   Wt Readings from Last 3 Encounters:  12/01/23 232 lb (105.2 kg)   11/15/23 232 lb (105.2 kg)  10/25/23 235 lb (106.6 kg)   There is no height or weight on file to calculate BMI.    Physical Exam     Lab Results  Component Value Date   WBC 6.1 02/12/2022   HGB 14.7 02/12/2022   HCT 42.8 02/12/2022   PLT 268.0 02/12/2022   GLUCOSE 95 02/12/2022   CHOL 231 (H) 02/12/2022   TRIG 139.0 02/12/2022   HDL 38.80 (L) 02/12/2022   LDLDIRECT 160.0 02/13/2021   LDLCALC 165 (H) 02/12/2022   ALT 41 02/12/2022   AST 18 02/12/2022   NA 139 02/12/2022   K 4.2 02/12/2022   CL 103 02/12/2022   CREATININE 0.95 02/12/2022   BUN 11 02/12/2022   CO2 28 02/12/2022   TSH 0.72 02/12/2022     Assessment & Plan:    See Problem List for Assessment and Plan of chronic medical problems.

## 2024-01-23 ENCOUNTER — Ambulatory Visit: Admitting: Internal Medicine

## 2024-01-23 DIAGNOSIS — F419 Anxiety disorder, unspecified: Secondary | ICD-10-CM

## 2024-01-23 DIAGNOSIS — F1010 Alcohol abuse, uncomplicated: Secondary | ICD-10-CM

## 2024-01-30 ENCOUNTER — Encounter: Attending: Physical Medicine & Rehabilitation | Admitting: Physical Medicine & Rehabilitation

## 2024-01-30 ENCOUNTER — Encounter: Payer: Self-pay | Admitting: Physical Medicine & Rehabilitation

## 2024-01-30 VITALS — BP 118/76 | HR 79 | Ht 72.0 in | Wt 239.6 lb

## 2024-01-30 DIAGNOSIS — Z79891 Long term (current) use of opiate analgesic: Secondary | ICD-10-CM | POA: Insufficient documentation

## 2024-01-30 DIAGNOSIS — G8929 Other chronic pain: Secondary | ICD-10-CM | POA: Insufficient documentation

## 2024-01-30 DIAGNOSIS — M25511 Pain in right shoulder: Secondary | ICD-10-CM | POA: Insufficient documentation

## 2024-01-30 DIAGNOSIS — G894 Chronic pain syndrome: Secondary | ICD-10-CM | POA: Insufficient documentation

## 2024-01-30 MED ORDER — OXYCODONE HCL 5 MG PO TABS: 5.0000 mg | ORAL_TABLET | Freq: Three times a day (TID) | ORAL | 0 refills | Status: AC | PRN
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
## 2024-01-31 ENCOUNTER — Other Ambulatory Visit: Payer: Self-pay

## 2024-04-02 ENCOUNTER — Encounter: Attending: Physical Medicine & Rehabilitation | Admitting: Physical Medicine & Rehabilitation

## 2024-05-02 ENCOUNTER — Encounter: Admitting: Internal Medicine
